# Patient Record
Sex: Female | Born: 1990 | Race: Black or African American | Hispanic: No | Marital: Single | Smoking: Never smoker
Health system: Southern US, Community
[De-identification: ages and names within clinical notes are randomized; demographics above are authoritative.]

## PROBLEM LIST (undated history)

## (undated) DIAGNOSIS — J45909 Unspecified asthma, uncomplicated: Secondary | ICD-10-CM

## (undated) DIAGNOSIS — L309 Dermatitis, unspecified: Secondary | ICD-10-CM

---

## 2018-07-29 ENCOUNTER — Encounter (HOSPITAL_COMMUNITY): Payer: Self-pay | Admitting: Emergency Medicine

## 2018-07-29 ENCOUNTER — Inpatient Hospital Stay (HOSPITAL_COMMUNITY)
Admission: EM | Admit: 2018-07-29 | Discharge: 2018-08-02 | DRG: 871 | Disposition: A | Discharge status: Home / Self Care | Payer: BLUE CROSS/BLUE SHIELD | Attending: Internal Medicine | Admitting: Internal Medicine

## 2018-07-29 ENCOUNTER — Emergency Department (HOSPITAL_COMMUNITY): Payer: BLUE CROSS/BLUE SHIELD

## 2018-07-29 DIAGNOSIS — Z23 Encounter for immunization: Secondary | ICD-10-CM

## 2018-07-29 DIAGNOSIS — R000 Tachycardia, unspecified: Secondary | ICD-10-CM | POA: Diagnosis not present

## 2018-07-29 DIAGNOSIS — E872 Acidosis: Secondary | ICD-10-CM | POA: Diagnosis present

## 2018-07-29 DIAGNOSIS — E8770 Fluid overload, unspecified: Secondary | ICD-10-CM | POA: Diagnosis present

## 2018-07-29 DIAGNOSIS — T380X5A Adverse effect of glucocorticoids and synthetic analogues, initial encounter: Secondary | ICD-10-CM | POA: Diagnosis present

## 2018-07-29 DIAGNOSIS — Z79899 Other long term (current) drug therapy: Secondary | ICD-10-CM

## 2018-07-29 DIAGNOSIS — A419 Sepsis, unspecified organism: Principal | ICD-10-CM | POA: Diagnosis present

## 2018-07-29 DIAGNOSIS — J45990 Exercise induced bronchospasm: Secondary | ICD-10-CM | POA: Diagnosis present

## 2018-07-29 DIAGNOSIS — D72829 Elevated white blood cell count, unspecified: Secondary | ICD-10-CM | POA: Diagnosis present

## 2018-07-29 DIAGNOSIS — J45901 Unspecified asthma with (acute) exacerbation: Secondary | ICD-10-CM | POA: Diagnosis present

## 2018-07-29 DIAGNOSIS — J189 Pneumonia, unspecified organism: Secondary | ICD-10-CM

## 2018-07-29 DIAGNOSIS — N179 Acute kidney failure, unspecified: Secondary | ICD-10-CM | POA: Diagnosis present

## 2018-07-29 DIAGNOSIS — J181 Lobar pneumonia, unspecified organism: Secondary | ICD-10-CM | POA: Diagnosis present

## 2018-07-29 DIAGNOSIS — J9601 Acute respiratory failure with hypoxia: Secondary | ICD-10-CM | POA: Diagnosis present

## 2018-07-29 HISTORY — DX: Unspecified asthma, uncomplicated: J45.909

## 2018-07-29 HISTORY — DX: Dermatitis, unspecified: L30.9

## 2018-07-29 LAB — CBC WITH DIFFERENTIAL/PLATELET
ABS IMMATURE GRANULOCYTES: 0.1 10*3/uL (ref 0.0–0.1)
BASOS ABS: 0 10*3/uL (ref 0.0–0.1)
Basophils Relative: 0 %
Eosinophils Absolute: 0.3 10*3/uL (ref 0.0–0.7)
Eosinophils Relative: 3 %
HCT: 43.4 % (ref 36.0–46.0)
Hemoglobin: 14.2 g/dL (ref 12.0–15.0)
Immature Granulocytes: 1 %
LYMPHS PCT: 7 %
Lymphs Abs: 0.7 10*3/uL (ref 0.7–4.0)
MCH: 30.7 pg (ref 26.0–34.0)
MCHC: 32.7 g/dL (ref 30.0–36.0)
MCV: 93.7 fL (ref 78.0–100.0)
Monocytes Absolute: 0.9 10*3/uL (ref 0.1–1.0)
Monocytes Relative: 8 %
NEUTROS ABS: 8.7 10*3/uL — AB (ref 1.7–7.7)
Neutrophils Relative %: 81 %
Platelets: 266 10*3/uL (ref 150–400)
RBC: 4.63 MIL/uL (ref 3.87–5.11)
RDW: 12.3 % (ref 11.5–15.5)
WBC: 10.7 10*3/uL — AB (ref 4.0–10.5)

## 2018-07-29 LAB — INFLUENZA PANEL BY PCR (TYPE A & B)
Influenza A By PCR: NEGATIVE
Influenza B By PCR: NEGATIVE

## 2018-07-29 LAB — I-STAT TROPONIN, ED: Troponin i, poc: 0.02 ng/mL (ref 0.00–0.08)

## 2018-07-29 LAB — COMPREHENSIVE METABOLIC PANEL
ALBUMIN: 3.9 g/dL (ref 3.5–5.0)
ALT: 14 U/L (ref 0–44)
ANION GAP: 13 (ref 5–15)
AST: 23 U/L (ref 15–41)
Alkaline Phosphatase: 54 U/L (ref 38–126)
BUN: 10 mg/dL (ref 6–20)
CO2: 18 mmol/L — ABNORMAL LOW (ref 22–32)
Calcium: 9.1 mg/dL (ref 8.9–10.3)
Chloride: 107 mmol/L (ref 98–111)
Creatinine, Ser: 0.98 mg/dL (ref 0.44–1.00)
GFR calc Af Amer: >60 mL/min (ref >60)
Glucose, Bld: 119 mg/dL — ABNORMAL HIGH (ref 70–99)
POTASSIUM: 3.6 mmol/L (ref 3.5–5.1)
Sodium: 138 mmol/L (ref 135–145)
TOTAL PROTEIN: 7.5 g/dL (ref 6.5–8.1)
Total Bilirubin: 0.7 mg/dL (ref 0.3–1.2)

## 2018-07-29 LAB — URINALYSIS, ROUTINE W REFLEX MICROSCOPIC
BILIRUBIN URINE: NEGATIVE
Glucose, UA: NEGATIVE mg/dL
Hgb urine dipstick: NEGATIVE
Ketones, ur: NEGATIVE mg/dL
Leukocytes, UA: NEGATIVE
NITRITE: NEGATIVE
Protein, ur: NEGATIVE mg/dL
Specific Gravity, Urine: 1.031 — ABNORMAL HIGH (ref 1.005–1.030)
pH: 5 (ref 5.0–8.0)

## 2018-07-29 LAB — I-STAT CG4 LACTIC ACID, ED
LACTIC ACID, VENOUS: 2.78 mmol/L — AB (ref 0.5–1.9)
LACTIC ACID, VENOUS: 4.98 mmol/L — AB (ref 0.5–1.9)

## 2018-07-29 LAB — I-STAT BETA HCG BLOOD, ED (MC, WL, AP ONLY)

## 2018-07-29 MED ORDER — SODIUM CHLORIDE 0.9 % IV BOLUS
1000.0000 mL | Freq: Once | INTRAVENOUS | Status: AC
  Administered 2018-07-29: 1000 mL via INTRAVENOUS

## 2018-07-29 MED ORDER — IPRATROPIUM BROMIDE 0.02 % IN SOLN
0.5000 mg | Freq: Once | RESPIRATORY_TRACT | Status: AC
  Administered 2018-07-29: 0.5 mg via RESPIRATORY_TRACT
  Filled 2018-07-29: qty 2.5

## 2018-07-29 MED ORDER — ALBUTEROL SULFATE (2.5 MG/3ML) 0.083% IN NEBU
5.0000 mg | INHALATION_SOLUTION | Freq: Once | RESPIRATORY_TRACT | Status: AC
  Administered 2018-07-29: 5 mg via RESPIRATORY_TRACT
  Filled 2018-07-29: qty 6

## 2018-07-29 MED ORDER — ONDANSETRON HCL 4 MG/2ML IJ SOLN
4.0000 mg | Freq: Once | INTRAMUSCULAR | Status: DC
  Filled 2018-07-29: qty 2

## 2018-07-29 MED ORDER — METHYLPREDNISOLONE SODIUM SUCC 125 MG IJ SOLR
125.0000 mg | Freq: Once | INTRAMUSCULAR | Status: AC
  Administered 2018-07-29: 125 mg via INTRAVENOUS
  Filled 2018-07-29: qty 2

## 2018-07-29 NOTE — ED Provider Notes (Signed)
[facility] EMERGENCY DEPARTMENT Provider Note   CSN: [id number] Arrival date & time: 07/29/18  1907     History   Chief Complaint Chief Complaint  Patient presents with  . Fever  . Shortness of Breath    HPI Joanna Swanson is a 27 y.o. female.  The history is provided by the patient and medical records.     27 y.o. F with hx of asthma here with fever/chills, generalized body aches, and worsening asthma exacerbation for the past 24 hours.  States she was around someone with a cold recently.  She has had very minor cough, non-productive.  She denies any chest pain.  No abdominal pain, nausea, vomiting, diarrhea.  No urinary symptoms.  States she has used 54 pumps of her albuterol inhaler today without relief.  Has not had anything to eat/drink all day today, has not really had much of an appetite.  Past Medical History:  Diagnosis Date  . Asthma     There are no active problems to display for this patient.   History reviewed. No pertinent surgical history.   OB History   None      Home Medications    Prior to Admission medications   Not on File    Family History No family history on file.  Social History Social History   Tobacco Use  . Smoking status: Never Smoker  . Smokeless tobacco: Never Used  Substance Use Topics  . Alcohol use: Yes    Comment: Socially   . Drug use: Not Currently     Allergies   Patient has no known allergies.   Review of Systems Review of Systems  Constitutional: Positive for fever.  Respiratory: Positive for shortness of breath and wheezing.   Musculoskeletal: Positive for myalgias.  All other systems reviewed and are negative.    Physical Exam Updated Vital Signs BP (!) 110/92 (BP Location: Right Arm)   Pulse (!) 127   Temp 100.2 F (37.9 C) (Oral)   Resp 20   Ht 5\' 10"  (1.778 m)   Wt 83.9 kg   SpO2 93%   BMI 26.54 kg/m   Physical Exam  Constitutional: She is oriented to person,  place, and time. She appears well-developed and well-nourished.  Overall appears fairly well, no acute distress  HENT:  Head: Normocephalic and atraumatic.  Right Ear: Tympanic membrane and ear canal normal.  Left Ear: Tympanic membrane and ear canal normal.  Nose: Nose normal.  Mouth/Throat: Oropharynx is clear and moist. Mucous membranes are dry.  Eyes: Pupils are equal, round, and reactive to light. Conjunctivae and EOM are normal.  Neck: Normal range of motion.  Cardiovascular: Regular rhythm and normal heart sounds. Tachycardia present.  HR 120's-130's during exam, no murmur  Pulmonary/Chest: Effort normal. She has no decreased breath sounds. She has wheezes.  No acute respiratory distress, able to speak in full sentences, does have some diffuse expiratory wheezes, no rhonchi or rales  Abdominal: Soft. Bowel sounds are normal. There is no tenderness. There is no rebound.  Musculoskeletal: Normal range of motion.  Neurological: She is alert and oriented to person, place, and time.  Skin: Skin is warm and dry.  Psychiatric: She has a normal mood and affect.  Nursing note and vitals reviewed.    ED Treatments / Results  Labs (all labs ordered are listed, but only abnormal results are displayed) Labs Reviewed  COMPREHENSIVE METABOLIC PANEL - Abnormal; Notable for the following components:  Result Value   CO2 18 (*)    Glucose, Bld 119 (*)    All other components within normal limits  CBC WITH DIFFERENTIAL/PLATELET - Abnormal; Notable for the following components:   WBC 10.7 (*)    Neutro Abs 8.7 (*)    All other components within normal limits  URINALYSIS, ROUTINE W REFLEX MICROSCOPIC - Abnormal; Notable for the following components:   APPearance CLOUDY (*)    Specific Gravity, Urine 1.031 (*)    All other components within normal limits  I-STAT CG4 LACTIC ACID, ED - Abnormal; Notable for the following components:   Lactic Acid, Venous 2.78 (*)    All other components  within normal limits  I-STAT CG4 LACTIC ACID, ED - Abnormal; Notable for the following components:   Lactic Acid, Venous 4.98 (*)    All other components within normal limits  CULTURE, BLOOD (ROUTINE X 2)  CULTURE, BLOOD (ROUTINE X 2)  INFLUENZA PANEL BY PCR (TYPE A & B)  I-STAT BETA HCG BLOOD, ED ([facility], [facility], [facility] ONLY)  I-STAT TROPONIN, ED    EKG None  Radiology Dg Chest 2 View  Result Date: 07/29/2018 CLINICAL DATA:  Shortness of breath and fever.  Asthma. EXAM: CHEST - 2 VIEW COMPARISON:  None. FINDINGS: The heart size and mediastinal contours are within normal limits. No evidence of pulmonary infiltrate or pleural effusion. Mild central peribronchial thickening noted, however no evidence of pulmonary hyperinflation. The visualized skeletal structures are unremarkable. IMPRESSION: No active cardiopulmonary disease. Electronically Signed   By: Myles Rosenthal M.D.   On: 07/29/2018 20:06    Procedures Procedures (including critical care time)  CRITICAL CARE Performed by: Garlon Hatchet   Total critical care time: 35 minutes  Critical care time was exclusive of separately billable procedures and treating other patients.  Critical care was necessary to treat or prevent imminent or life-threatening deterioration.  Critical care was time spent personally by me on the following activities: development of treatment plan with patient and/or surrogate as well as nursing, discussions with consultants, evaluation of patient's response to treatment, examination of patient, obtaining history from patient or surrogate, ordering and performing treatments and interventions, ordering and review of laboratory studies, ordering and review of radiographic studies, pulse oximetry and re-evaluation of patient's condition.   Medications Ordered in ED Medications  ondansetron (ZOFRAN) injection 4 mg (4 mg Intravenous Refused 07/29/18 2301)  iopamidol (ISOVUE-370) 76 % injection (has no administration in time  range)  cefTRIAXone (ROCEPHIN) 1 g in sodium chloride 0.9 % 100 mL IVPB (1 g Intravenous New Bag/Given 07/30/18 0246)  azithromycin (ZITHROMAX) 500 mg in sodium chloride 0.9 % 250 mL IVPB (has no administration in time range)  albuterol (PROVENTIL) (2.5 MG/3ML) 0.083% nebulizer solution 5 mg (5 mg Nebulization Given 07/29/18 1932)  sodium chloride 0.9 % bolus 1,000 mL (0 mLs Intravenous Stopped 07/30/18 0101)  albuterol (PROVENTIL) (2.5 MG/3ML) 0.083% nebulizer solution 5 mg (5 mg Nebulization Given 07/29/18 2257)  ipratropium (ATROVENT) nebulizer solution 0.5 mg (0.5 mg Nebulization Given 07/29/18 2257)  methylPREDNISolone sodium succinate (SOLU-MEDROL) 125 mg/2 mL injection 125 mg (125 mg Intravenous Given 07/29/18 2258)  sodium chloride 0.9 % bolus 1,000 mL (0 mLs Intravenous Stopped 07/30/18 0101)  ketorolac (TORADOL) 30 MG/ML injection 30 mg (30 mg Intravenous Given 07/30/18 0059)  acetaminophen (TYLENOL) tablet 1,000 mg (1,000 mg Oral Given 07/30/18 0058)  sodium chloride 0.9 % bolus 1,000 mL (0 mLs Intravenous Stopped 07/30/18 0159)  iopamidol (ISOVUE-370) 76 % injection 100  mL (54 mLs Intravenous Contrast Given 07/30/18 0157)     Initial Impression / Assessment and Plan / ED Course  I have reviewed the triage vital signs and the nursing notes.  Pertinent labs & imaging results that were available during my care of the patient were reviewed by me and considered in my medical decision making (see chart for details).  27 year old female here with low-grade fever, shortness of breath, and body aches for the past 24 hours.  Mildly febrile but overall nontoxic in appearance.  Initial lactate 2.78, WBC count 10.7.  Chest x-ray clear.  Influenza test also negative.  Patient does report sick contacts with cold recently and feels like her asthma symptoms are worsening.  Was given neb in triage with some improvement but symptoms recurring.  She remains tachycardic into the upper 120s, low 130s.  Does have  some wheezes on exam.  CXR clear.  We will plan for IV fluids, Solu-Medrol, additional nebs.  Given Tylenol for fever.  Notified by lab the second lactate now 4.94.  Blood cultures added.  1:00 AM Patient reassessed.  Has received 2L of IVF and HR still around 126.  States she still has some intermittent SOB.  States chest mostly feels tight centrally.  Denies hx of DVT or PE, no family history of clotting disorder.  Not on OCP's or other exogenous estrogens, no recent travel, surgery, prolonged immobilization.  Will obtain CTA to evaluate for PE or possible occult pneumonia not seen on CXR.  2:29 AM CTA negative for PE but does reveal RML and RUL pneumonia.  After 3 L, patient's heart rate still 120, blood pressure still soft at 100/52.  At this point has not had any great improvement with interventions offered here in the ED.  Will admit for IV antibiotics and ongoing management.  IV ropcehin and azithromycin started.  Discussed with Dr. Antionette Char-- will admit for ongoing care.  Final Clinical Impressions(s) / ED Diagnoses   Final diagnoses:  Sepsis due to pneumonia [facility])  Tachycardia    ED Discharge Orders    None       Garlon Hatchet, PA-C 07/30/18 0257    Sabas Sous, MD 08/04/18 1146

## 2018-07-29 NOTE — ED Notes (Signed)
Pt walked to room with steady gait

## 2018-07-29 NOTE — ED Notes (Signed)
Dr. Rush Landmark aware Lactic 2.78

## 2018-07-29 NOTE — ED Triage Notes (Signed)
Pt reports SOB, fever/chills, body aches X1 day. Pt states she has hx of asthma, has been using her inhaler at home with no relief.

## 2018-07-30 ENCOUNTER — Encounter (HOSPITAL_COMMUNITY): Payer: Self-pay | Admitting: Family Medicine

## 2018-07-30 ENCOUNTER — Emergency Department (HOSPITAL_COMMUNITY): Payer: BLUE CROSS/BLUE SHIELD

## 2018-07-30 DIAGNOSIS — J45901 Unspecified asthma with (acute) exacerbation: Secondary | ICD-10-CM | POA: Diagnosis present

## 2018-07-30 DIAGNOSIS — A419 Sepsis, unspecified organism: Secondary | ICD-10-CM | POA: Diagnosis present

## 2018-07-30 DIAGNOSIS — J189 Pneumonia, unspecified organism: Secondary | ICD-10-CM

## 2018-07-30 LAB — CBC WITH DIFFERENTIAL/PLATELET
Abs Immature Granulocytes: 0 10*3/uL (ref 0.0–0.1)
BASOS ABS: 0 10*3/uL (ref 0.0–0.1)
BASOS PCT: 0 %
EOS PCT: 0 %
Eosinophils Absolute: 0 10*3/uL (ref 0.0–0.7)
HCT: 37.7 % (ref 36.0–46.0)
HEMOGLOBIN: 12.5 g/dL (ref 12.0–15.0)
Immature Granulocytes: 0 %
Lymphocytes Relative: 7 %
Lymphs Abs: 0.7 10*3/uL (ref 0.7–4.0)
MCH: 30.9 pg (ref 26.0–34.0)
MCHC: 33.2 g/dL (ref 30.0–36.0)
MCV: 93.1 fL (ref 78.0–100.0)
MONO ABS: 0.2 10*3/uL (ref 0.1–1.0)
Monocytes Relative: 2 %
Neutro Abs: 8.6 10*3/uL — ABNORMAL HIGH (ref 1.7–7.7)
Neutrophils Relative %: 91 %
Platelets: 218 10*3/uL (ref 150–400)
RBC: 4.05 MIL/uL (ref 3.87–5.11)
RDW: 12.3 % (ref 11.5–15.5)
WBC: 9.5 10*3/uL (ref 4.0–10.5)

## 2018-07-30 LAB — BASIC METABOLIC PANEL
Anion gap: 7 (ref 5–15)
BUN: 7 mg/dL (ref 6–20)
CALCIUM: 8.5 mg/dL — AB (ref 8.9–10.3)
CO2: 18 mmol/L — ABNORMAL LOW (ref 22–32)
Chloride: 112 mmol/L — ABNORMAL HIGH (ref 98–111)
Creatinine, Ser: 0.86 mg/dL (ref 0.44–1.00)
GFR calc Af Amer: >60 mL/min (ref >60)
GLUCOSE: 163 mg/dL — AB (ref 70–99)
Potassium: 3.8 mmol/L (ref 3.5–5.1)
SODIUM: 137 mmol/L (ref 135–145)

## 2018-07-30 LAB — HCG, QUANTITATIVE, PREGNANCY

## 2018-07-30 LAB — CBG MONITORING, ED: Glucose-Capillary: 148 mg/dL — ABNORMAL HIGH (ref 70–99)

## 2018-07-30 LAB — STREP PNEUMONIAE URINARY ANTIGEN: Strep Pneumo Urinary Antigen: NEGATIVE

## 2018-07-30 LAB — HIV ANTIBODY (ROUTINE TESTING W REFLEX): HIV SCREEN 4TH GENERATION: NONREACTIVE

## 2018-07-30 MED ORDER — SODIUM CHLORIDE 0.9 % IV SOLN
500.0000 mg | Freq: Once | INTRAVENOUS | Status: AC
  Administered 2018-07-30: 500 mg via INTRAVENOUS
  Filled 2018-07-30: qty 500

## 2018-07-30 MED ORDER — SODIUM CHLORIDE 0.9 % IV SOLN
1.0000 g | INTRAVENOUS | Status: DC
  Administered 2018-07-31: 1 g via INTRAVENOUS
  Filled 2018-07-30 (×2): qty 10

## 2018-07-30 MED ORDER — POTASSIUM CHLORIDE IN NACL 20-0.9 MEQ/L-% IV SOLN
INTRAVENOUS | Status: AC
  Administered 2018-07-30: 06:00:00 via INTRAVENOUS
  Filled 2018-07-30: qty 1000

## 2018-07-30 MED ORDER — SODIUM CHLORIDE 0.9% FLUSH
3.0000 mL | Freq: Two times a day (BID) | INTRAVENOUS | Status: DC
  Administered 2018-07-30 – 2018-08-02 (×7): 3 mL via INTRAVENOUS

## 2018-07-30 MED ORDER — IOPAMIDOL (ISOVUE-370) INJECTION 76%
INTRAVENOUS | Status: AC
  Filled 2018-07-30: qty 100

## 2018-07-30 MED ORDER — ALBUTEROL SULFATE (2.5 MG/3ML) 0.083% IN NEBU
2.5000 mg | INHALATION_SOLUTION | RESPIRATORY_TRACT | Status: DC | PRN
  Administered 2018-07-30 – 2018-07-31 (×3): 2.5 mg via RESPIRATORY_TRACT
  Filled 2018-07-30 (×3): qty 3

## 2018-07-30 MED ORDER — METHYLPREDNISOLONE SODIUM SUCC 125 MG IJ SOLR
60.0000 mg | Freq: Three times a day (TID) | INTRAMUSCULAR | Status: DC
  Administered 2018-07-30 – 2018-07-31 (×4): 60 mg via INTRAVENOUS
  Filled 2018-07-30 (×4): qty 2

## 2018-07-30 MED ORDER — HYDROCODONE-ACETAMINOPHEN 5-325 MG PO TABS: 1.0000 | ORAL_TABLET | ORAL | Status: DC | PRN

## 2018-07-30 MED ORDER — ACETAMINOPHEN 500 MG PO TABS
1000.0000 mg | ORAL_TABLET | Freq: Once | ORAL | Status: AC
  Administered 2018-07-30: 1000 mg via ORAL
  Filled 2018-07-30: qty 2

## 2018-07-30 MED ORDER — DIPHENHYDRAMINE HCL 25 MG PO CAPS
25.0000 mg | ORAL_CAPSULE | Freq: Four times a day (QID) | ORAL | Status: DC | PRN
  Administered 2018-07-30 – 2018-07-31 (×2): 25 mg via ORAL
  Filled 2018-07-30 (×3): qty 1

## 2018-07-30 MED ORDER — IOPAMIDOL (ISOVUE-370) INJECTION 76%
100.0000 mL | Freq: Once | INTRAVENOUS | Status: AC | PRN
  Administered 2018-07-30: 54 mL via INTRAVENOUS

## 2018-07-30 MED ORDER — SENNOSIDES-DOCUSATE SODIUM 8.6-50 MG PO TABS: 1.0000 | ORAL_TABLET | Freq: Every evening | ORAL | Status: DC | PRN

## 2018-07-30 MED ORDER — PREDNISONE 20 MG PO TABS
40.0000 mg | ORAL_TABLET | Freq: Every day | ORAL | Status: DC
  Administered 2018-07-30: 40 mg via ORAL
  Filled 2018-07-30: qty 2

## 2018-07-30 MED ORDER — SODIUM CHLORIDE 0.9 % IV BOLUS
1000.0000 mL | Freq: Once | INTRAVENOUS | Status: AC
  Administered 2018-07-30: 1000 mL via INTRAVENOUS

## 2018-07-30 MED ORDER — KETOROLAC TROMETHAMINE 30 MG/ML IJ SOLN
30.0000 mg | Freq: Once | INTRAMUSCULAR | Status: AC
  Administered 2018-07-30: 30 mg via INTRAVENOUS
  Filled 2018-07-30: qty 1

## 2018-07-30 MED ORDER — SODIUM CHLORIDE 0.9 % IV SOLN
25.0000 mg | Freq: Four times a day (QID) | INTRAVENOUS | Status: DC | PRN
  Filled 2018-07-30: qty 0.5

## 2018-07-30 MED ORDER — TRIAMCINOLONE 0.1 % CREAM:EUCERIN CREAM 1:1: TOPICAL_CREAM | Freq: Three times a day (TID) | CUTANEOUS | Status: DC | PRN

## 2018-07-30 MED ORDER — SODIUM CHLORIDE 0.9 % IV SOLN
500.0000 mg | INTRAVENOUS | Status: DC
  Administered 2018-07-31 – 2018-08-01 (×2): 500 mg via INTRAVENOUS
  Filled 2018-07-30 (×2): qty 500

## 2018-07-30 MED ORDER — ONDANSETRON HCL 4 MG PO TABS: 4.0000 mg | ORAL_TABLET | Freq: Four times a day (QID) | ORAL | Status: DC | PRN

## 2018-07-30 MED ORDER — ONDANSETRON HCL 4 MG/2ML IJ SOLN: 4.0000 mg | Freq: Four times a day (QID) | INTRAMUSCULAR | Status: DC | PRN

## 2018-07-30 MED ORDER — MAGNESIUM SULFATE 2 GM/50ML IV SOLN
2.0000 g | Freq: Once | INTRAVENOUS | Status: AC
  Administered 2018-07-30: 2 g via INTRAVENOUS
  Filled 2018-07-30: qty 50

## 2018-07-30 MED ORDER — ACETAMINOPHEN 650 MG RE SUPP: 650.0000 mg | Freq: Four times a day (QID) | RECTAL | Status: DC | PRN

## 2018-07-30 MED ORDER — ACETAMINOPHEN 325 MG PO TABS: 650.0000 mg | ORAL_TABLET | Freq: Four times a day (QID) | ORAL | Status: DC | PRN

## 2018-07-30 MED ORDER — ENOXAPARIN SODIUM 40 MG/0.4ML ~~LOC~~ SOLN
40.0000 mg | SUBCUTANEOUS | Status: DC
  Administered 2018-07-30 – 2018-08-01 (×3): 40 mg via SUBCUTANEOUS
  Filled 2018-07-30 (×4): qty 0.4

## 2018-07-30 MED ORDER — SODIUM CHLORIDE 0.9 % IV SOLN
1.0000 g | Freq: Once | INTRAVENOUS | Status: AC
  Administered 2018-07-30: 1 g via INTRAVENOUS
  Filled 2018-07-30: qty 10

## 2018-07-30 NOTE — ED Notes (Signed)
Admitting page,   Wilhemena Durie room c26 I can prednisone at 0725 do you still want me to give the Solumedrol now?   Abygail Galeno RN

## 2018-07-30 NOTE — ED Notes (Signed)
Breakfast Tray Ordered @ 0718-per Janee, RN-called by Marylene Land

## 2018-07-30 NOTE — ED Notes (Signed)
Provider re-paged. 

## 2018-07-30 NOTE — ED Notes (Signed)
Pt CBG 148 

## 2018-07-30 NOTE — ED Notes (Signed)
Pt ambulated with steady gait and no SOB or dizziness. Pt oxygen level stayed at 94% and pulse was 114.

## 2018-07-30 NOTE — ED Notes (Signed)
Admitting at bedside.   Joanna Swanson  has requested Benadryl for her allergies.   Jesse Sans RN

## 2018-07-30 NOTE — ED Notes (Signed)
Patient eating breakfast. No complaints at this time.

## 2018-07-30 NOTE — ED Notes (Signed)
Breakfast Tray Ordered. 

## 2018-07-30 NOTE — ED Notes (Signed)
Pt. To CT via stretcher. 

## 2018-07-30 NOTE — ED Notes (Signed)
Pt. Given graham crackers with peanut butter and ginger ale.

## 2018-07-30 NOTE — H&P (Signed)
History and Physical    Joanna Swanson JXB:[id number] DOB: 1991-02-15 DOA: 07/29/2018  PCP: Patient, No Pcp Per   Patient coming from: Home   Chief Complaint: Fever/chills, SOB, productive cough, wheezing   HPI: Joanna Swanson is a 27 y.o. female with medical history significant for asthma and eczema, now presenting to the emergency department for evaluation of fevers, chills, shortness of breath, wheezing, and productive cough.  Patient reports that symptoms began 2 days prior to arrival, she began to use her inhaler, but reports progressive worsening despite her albuterol.  She denies any chest pain, leg swelling, or leg tenderness.  Denies any sick contacts or recent travel.  Reports that she had been experiencing some rhinorrhea and sore throat recently, but that has resolved.  ED Course: Upon arrival to the ED, patient is found to have a temperature 37.9 C, tachycardia in the 120s, blood pressure 90/56, and O2 saturation in the low 90s on room air.  EKG features sinus tachycardia with rate 125 and PAC.  Chest x-ray is negative for acute cardiopulmonary disease.  Chemistry panel is notable for a bicarbonate of 18 and CBC features a mild leukocytosis.  CTA chest is negative for PE but notable for patchy consolidation involving the right middle lobe, and to a lesser extent the right upper lobe, concerning for pneumonia.  Blood cultures were collected and the patient was treated with 3 L of normal saline, 125 mg IV Solu-Medrol, Rocephin, azithromycin, DuoNeb, and albuterol neb.  She reports subjective improvement with these measures and appears to have less work of breathing, but remains dyspneic at rest and lactic acid has increased.  She will be observed for further evaluation and management of community-acquired pneumonia with exacerbation in asthma.  Review of Systems:  All other systems reviewed and apart from HPI, are negative.  Past Medical History:  Diagnosis Date  . Asthma   . Eczema      History reviewed. No pertinent surgical history.   reports that she has never smoked. She has never used smokeless tobacco. She reports that she drinks alcohol. She reports that she has current or past drug history.  No Known Allergies  Family History  Problem Relation Age of Onset  . Hypertension Father   . Hypertension Paternal Grandfather      Prior to Admission medications   Medication Sig Start Date End Date Taking? Authorizing Provider  albuterol (PROVENTIL HFA;VENTOLIN HFA) 108 (90 Base) MCG/ACT inhaler Inhale 2 puffs into the lungs every 4 (four) hours as needed for wheezing or shortness of breath.  05/21/18  Yes [provider]  ibuprofen (ADVIL,MOTRIN) 200 MG tablet Take 600 mg by mouth every 6 (six) hours as needed (for pain or headaches).   Yes [provider]    Physical Exam: Vitals:   07/29/18 2321 07/30/18 0000 07/30/18 0200 07/30/18 0300  BP:  (!) 105/59 (!) 100/52 106/70  Pulse:  (!) 129 (!) 116 (!) 117  Resp: 19 (!) 22 16 16   Temp:      TempSrc:      SpO2:  97% 94% 95%  Weight:      Height:         Constitutional: Tachypneic, calm, no pallor Eyes: PERTLA, lids and conjunctivae normal ENMT: Mucous membranes are moist. Posterior pharynx clear of any exudate or lesions.   Neck: normal, supple, no masses, no thyromegaly Respiratory: Tachypneic, dyspneic with speech. Rhonchi, wheezes. No pallor or cyanosis. No accessory muscle use.  Cardiovascular: Rate ~120 and regular.  No extremity edema.   Abdomen: No distension, no tenderness, soft. Bowel sounds normal.  Musculoskeletal: no clubbing / cyanosis. No joint deformity upper and lower extremities.    Skin: Xerosis with lichenification involving AC fossae and nape of neck. Warm, dry, well-perfused. Neurologic: CN 2-12 grossly intact. Sensation intact. Strength 5/5 in all 4 limbs.  Psychiatric:  Alert and oriented x 3. Pleasant and cooperative.    Labs on Admission: I have personally  reviewed following labs and imaging studies  CBC: Recent Labs  Lab 07/29/18 1936  WBC 10.7*  NEUTROABS 8.7*  HGB 14.2  HCT 43.4  MCV 93.7  PLT 266   Basic Metabolic Panel: Recent Labs  Lab 07/29/18 1936  NA 138  K 3.6  CL 107  CO2 18*  GLUCOSE 119*  BUN 10  CREATININE 0.98  CALCIUM 9.1   GFR: Estimated Creatinine Clearance: 103.5 mL/min (by C-G formula based on SCr of 0.98 mg/dL). Liver Function Tests: Recent Labs  Lab 07/29/18 1936  AST 23  ALT 14  ALKPHOS 54  BILITOT 0.7  PROT 7.5  ALBUMIN 3.9   No results for input(s): LIPASE, AMYLASE in the last 168 hours. No results for input(s): AMMONIA in the last 168 hours. Coagulation Profile: No results for input(s): INR, PROTIME in the last 168 hours. Cardiac Enzymes: No results for input(s): CKTOTAL, CKMB, CKMBINDEX, TROPONINI in the last 168 hours. BNP (last 3 results) No results for input(s): PROBNP in the last 8760 hours. HbA1C: No results for input(s): HGBA1C in the last 72 hours. CBG: No results for input(s): GLUCAP in the last 168 hours. Lipid Profile: No results for input(s): CHOL, HDL, LDLCALC, TRIG, CHOLHDL, LDLDIRECT in the last 72 hours. Thyroid Function Tests: No results for input(s): TSH, T4TOTAL, FREET4, T3FREE, THYROIDAB in the last 72 hours. Anemia Panel: No results for input(s): VITAMINB12, FOLATE, FERRITIN, TIBC, IRON, RETICCTPCT in the last 72 hours. Urine analysis:    Component Value Date/Time   COLORURINE YELLOW 07/29/2018 1929   APPEARANCEUR CLOUDY (A) 07/29/2018 1929   LABSPEC 1.031 (H) 07/29/2018 1929   PHURINE 5.0 07/29/2018 1929   GLUCOSEU NEGATIVE 07/29/2018 1929   HGBUR NEGATIVE 07/29/2018 1929   BILIRUBINUR NEGATIVE 07/29/2018 1929   KETONESUR NEGATIVE 07/29/2018 1929   PROTEINUR NEGATIVE 07/29/2018 1929   NITRITE NEGATIVE 07/29/2018 1929   LEUKOCYTESUR NEGATIVE 07/29/2018 1929   Sepsis Labs: @LABRCNTIP (procalcitonin:4,lacticidven:4) )No results found for this or any  previous visit (from the past 240 hour(s)).   Radiological Exams on Admission: Dg Chest 2 View  Result Date: 07/29/2018 CLINICAL DATA:  Shortness of breath and fever.  Asthma. EXAM: CHEST - 2 VIEW COMPARISON:  None. FINDINGS: The heart size and mediastinal contours are within normal limits. No evidence of pulmonary infiltrate or pleural effusion. Mild central peribronchial thickening noted, however no evidence of pulmonary hyperinflation. The visualized skeletal structures are unremarkable. IMPRESSION: No active cardiopulmonary disease. Electronically Signed   By: Myles Rosenthal M.D.   On: 07/29/2018 20:06   Ct Angio Chest Pe W And/or Wo Contrast  Result Date: 07/30/2018 CLINICAL DATA:  Shortness of breath and tachycardia. Fever and chills. EXAM: CT ANGIOGRAPHY CHEST WITH CONTRAST TECHNIQUE: Multidetector CT imaging of the chest was performed using the standard protocol during bolus administration of intravenous contrast. Multiplanar CT image reconstructions and MIPs were obtained to evaluate the vascular anatomy. CONTRAST:  44mL ISOVUE-370 IOPAMIDOL (ISOVUE-370) INJECTION 76% COMPARISON:  Radiographs yesterday. FINDINGS: Cardiovascular: There are no filling defects within the pulmonary arteries to suggest pulmonary embolus.Thoracic  aorta is normal in caliber without dissection. Common origin of the brachiocephalic and left common carotid artery, normal variant. The heart is normal in size. No pericardial effusion. Mediastinum/Nodes: Prominent bilateral hilar nodes, right greater than left measuring up to 11 mm. Small prevascular nodes. Triangular soft tissue density in the anterior mediastinum consistent with residual or recurrent thymus. The esophagus is decompressed. Visualized thyroid gland is unremarkable. Lungs/Pleura: Patchy consolidation in the anterior right middle lobe, and to a lesser extent paramediastinal and apical right upper lobe. Mild central bronchial thickening. No pleural fluid or evidence  pulmonary edema. Trachea and mainstem bronchi are patent. Upper Abdomen: No acute abnormality. Musculoskeletal: There are no acute or suspicious osseous abnormalities. Review of the MIP images confirms the above findings. IMPRESSION: 1. No pulmonary embolus. 2. Patchy consolidation in the right middle and to a lesser extent right upper lobes consistent with pneumonia. Prominent bilateral hilar nodes are likely reactive. 3. Mild central bronchial thickening may be bronchitis or asthma. Electronically Signed   By: Narda Rutherford M.D.   On: 07/30/2018 02:19    EKG: Independently reviewed. Sinus tachycardia (rate 125), PAC.   Assessment/Plan   1. Sepsis secondary to pneumonia  - Presents with SOB, productive cough, wheezing, and fever/chills  - She is found to have low-grade temp, leukocytosis, tachycardia, tachypnea, and lactic acidosis  - CTA chest negative for PE but concerning for pneumonia involving RML and RUL  - Blood cultures were collected in ED and she was started on Rocephin and azithromycin  - She has improved with treatment in ED but remains dyspneic at rest and lactic acidosis has worsened  - Check sputum culture and strep pneumo antigen, continue current antibiotics, treat associated asthma exacerbation as below, follow cultures and clinical course    2. Acute asthma exacerbation  - Presents with SOB, productive cough, wheezing  - Has improved with steroid and nebs in ED, but remains dyspneic at rest  - Continue systemic steroid and prn albuterol, give 2 g IV magnesium, use supplemental O2 as needed     DVT prophylaxis: Lovenox Code Status: Full  Family Communication: Discussed with patient  Consults called: None Admission status: Observation     Briscoe Deutscher, MD [facility] Pager [phone]  If 7PM-7AM, please contact night-coverage [email or web address] Password [facility]  07/30/2018, 4:32 AM

## 2018-07-30 NOTE — Progress Notes (Signed)
@IPLOG @        PROGRESS NOTE                                                                                                                                                                                                             Patient Demographics:    Joanna Swanson, is a 27 y.o. female, DOB - 1991-05-26, WGN:[id number]  Admit date - 07/29/2018   Admitting Physician Briscoe Deutscher, MD  Outpatient Primary MD for the patient is Patient, No Pcp Per  LOS - 0  Chief Complaint  Patient presents with  . Fever  . Shortness of Breath       Brief Narrative   Joanna Swanson is a 27 y.o. female with medical history significant for asthma and eczema, now presenting to the emergency department for evaluation of fevers, chills, shortness of breath, wheezing, and productive cough, was diagnosed with community-acquired pneumonia along with asthma exacerbation causing acute hypoxic respiratory failure and she was admitted to the hospital for further treatment.   Subjective:    Joanna Swanson today has, No headache, No chest pain, No abdominal pain - No Nausea, No new weakness tingling or numbness, No Cough - improved SOB.     Assessment  & Plan :      1.  Sepsis and acute hypoxic respiratory failure due to CAP and acute on chronic exacerbation.  She has been adequately hydrated and will continue IV fluids , continue empiric IV azithromycin and Rocephin, IV Solu-Medrol, scheduled and as needed nebulizer treatment, oxygen as needed.  Add flutter valve for pulmonary toiletry, advance activity, check ambulatory pulse ox.  Encouraged to sit in chair in the daytime.  Likely discharge in 1 to 2 days if back to baseline.  Already feels better.  Follow cultures.  Quantitative hCG ordered.    Family Communication  :  None  Code Status :  Full  Disposition Plan  :  Home 1-2 days  Consults  :  None  Procedures  :    CTA - 1. No pulmonary embolus. 2. Patchy consolidation in the right  middle and to a lesser extent right upper lobes consistent with pneumonia. Prominent bilateral hilar nodes are likely reactive. 3. Mild central bronchial thickening may be bronchitis or asthma.  DVT Prophylaxis  :  Lovenox    Lab Results  Component Value Date   PLT 218 07/30/2018    Diet :  Diet Order            Diet regular Room service  appropriate? Yes; Fluid consistency: Thin  Diet effective now               Inpatient Medications Scheduled Meds: . enoxaparin (LOVENOX) injection  40 mg Subcutaneous Q24H  . iopamidol      . methylPREDNISolone (SOLU-MEDROL) injection  60 mg Intravenous Q8H  . sodium chloride flush  3 mL Intravenous Q12H   Continuous Infusions: . 0.9 % NaCl with KCl 20 mEq / L 100 mL/hr at 07/30/18 0612  . [START ON 07/31/2018] azithromycin    . [START ON 07/31/2018] cefTRIAXone (ROCEPHIN)  IV     PRN Meds:.acetaminophen **OR** acetaminophen, albuterol, HYDROcodone-acetaminophen, ondansetron **OR** ondansetron (ZOFRAN) IV, senna-docusate, triamcinolone 0.1 % cream : eucerin  Antibiotics  :   Anti-infectives (From admission, onward)   Start     Dose/Rate Route Frequency Ordered Stop   07/31/18 0400  azithromycin (ZITHROMAX) 500 mg in sodium chloride 0.9 % 250 mL IVPB     500 mg 250 mL/hr over 60 Minutes Intravenous Every 24 hours 07/30/18 0432 08/06/18 0359   07/31/18 0200  cefTRIAXone (ROCEPHIN) 1 g in sodium chloride 0.9 % 100 mL IVPB     1 g 200 mL/hr over 30 Minutes Intravenous Every 24 hours 07/30/18 0432 08/06/18 0159   07/30/18 0230  cefTRIAXone (ROCEPHIN) 1 g in sodium chloride 0.9 % 100 mL IVPB     1 g 200 mL/hr over 30 Minutes Intravenous  Once 07/30/18 0229 07/30/18 0323   07/30/18 0230  azithromycin (ZITHROMAX) 500 mg in sodium chloride 0.9 % 250 mL IVPB     500 mg 250 mL/hr over 60 Minutes Intravenous  Once 07/30/18 0229 07/30/18 0433          Objective:   Vitals:   07/30/18 0600 07/30/18 0700 07/30/18 0800 07/30/18 0815  BP: 113/70  112/63 110/64   Pulse: (!) 113 (!) 116 (!) 112 (!) 102  Resp: (!) 24 20 (!) 28 16  Temp:      TempSrc:      SpO2: 96% 96% 94% 94%  Weight:      Height:        Wt Readings from Last 3 Encounters:  07/29/18 83.9 kg     Intake/Output Summary (Last 24 hours) at 07/30/2018 0919 Last data filed at 07/30/2018 0617 Gross per 24 hour  Intake 3385.94 ml  Output -  Net 3385.94 ml     Physical Exam  Awake Alert, Oriented X 3, No new F.N deficits, Normal affect [place].AT,PERRAL Supple Neck,No JVD, No cervical lymphadenopathy appriciated.  Symmetrical Chest wall movement, Good air movement bilaterally, +ve wheezing RRR,No Gallops,Rubs or new Murmurs, No Parasternal Heave +ve B.Sounds, Abd Soft, No tenderness, No organomegaly appriciated, No rebound - guarding or rigidity. No Cyanosis, Clubbing or edema, No new Rash or bruise       Data Review:    CBC Recent Labs  Lab 07/29/18 1936 07/30/18 0640  WBC 10.7* 9.5  HGB 14.2 12.5  HCT 43.4 37.7  PLT 266 218  MCV 93.7 93.1  MCH 30.7 30.9  MCHC 32.7 33.2  RDW 12.3 12.3  LYMPHSABS 0.7 0.7  MONOABS 0.9 0.2  EOSABS 0.3 0.0  BASOSABS 0.0 0.0    Chemistries  Recent Labs  Lab 07/29/18 1936 07/30/18 0500  NA 138 137  K 3.6 3.8  CL 107 112*  CO2 18* 18*  GLUCOSE 119* 163*  BUN 10 7  CREATININE 0.98 0.86  CALCIUM 9.1 8.5*  AST 23  --   ALT  14  --   ALKPHOS 54  --   BILITOT 0.7  --    ------------------------------------------------------------------------------------------------------------------ Lactic Acid, Venous    Component Value Date/Time   LATICACIDVEN 4.98 (HH) 07/29/2018 2219    ------------------------------------------------------------------------------------------------------------------ No results for input(s): TSH, T4TOTAL, T3FREE, THYROIDAB in the last 72 hours.  Invalid input(s):  FREET3 ------------------------------------------------------------------------------------------------------------------ No results for input(s): VITAMINB12, FOLATE, FERRITIN, TIBC, IRON, RETICCTPCT in the last 72 hours.  Coagulation profile No results for input(s): INR, PROTIME in the last 168 hours.  No results for input(s): DDIMER in the last 72 hours.  Cardiac Enzymes No results for input(s): CKMB, TROPONINI, MYOGLOBIN in the last 168 hours.  Invalid input(s): CK ------------------------------------------------------------------------------------------------------------------ No results found for: BNP  Micro Results Recent Results (from the past 240 hour(s))  Blood culture (routine x 2)     Status: None (Preliminary result)   Collection Time: 07/29/18 10:50 PM  Result Value Ref Range Status   Specimen Description BLOOD RIGHT ARM  Final   Special Requests   Final    BOTTLES DRAWN AEROBIC AND ANAEROBIC Blood Culture results may not be optimal due to an excessive volume of blood received in culture bottles   Culture   Final    NO GROWTH < 12 HOURS Performed at [facility], [address], [place], [place] [address]    Report Status PENDING  Incomplete    Radiology Reports Dg Chest 2 View  Result Date: 07/29/2018 CLINICAL DATA:  Shortness of breath and fever.  Asthma. EXAM: CHEST - 2 VIEW COMPARISON:  None. FINDINGS: The heart size and mediastinal contours are within normal limits. No evidence of pulmonary infiltrate or pleural effusion. Mild central peribronchial thickening noted, however no evidence of pulmonary hyperinflation. The visualized skeletal structures are unremarkable. IMPRESSION: No active cardiopulmonary disease. Electronically Signed   By: Myles Rosenthal M.D.   On: 07/29/2018 20:06   Ct Angio Chest Pe W And/or Wo Contrast  Result Date: 07/30/2018 CLINICAL DATA:  Shortness of breath and tachycardia. Fever and chills. EXAM: CT ANGIOGRAPHY CHEST WITH CONTRAST  TECHNIQUE: Multidetector CT imaging of the chest was performed using the standard protocol during bolus administration of intravenous contrast. Multiplanar CT image reconstructions and MIPs were obtained to evaluate the vascular anatomy. CONTRAST:  1mL ISOVUE-370 IOPAMIDOL (ISOVUE-370) INJECTION 76% COMPARISON:  Radiographs yesterday. FINDINGS: Cardiovascular: There are no filling defects within the pulmonary arteries to suggest pulmonary embolus.Thoracic aorta is normal in caliber without dissection. Common origin of the brachiocephalic and left common carotid artery, normal variant. The heart is normal in size. No pericardial effusion. Mediastinum/Nodes: Prominent bilateral hilar nodes, right greater than left measuring up to 11 mm. Small prevascular nodes. Triangular soft tissue density in the anterior mediastinum consistent with residual or recurrent thymus. The esophagus is decompressed. Visualized thyroid gland is unremarkable. Lungs/Pleura: Patchy consolidation in the anterior right middle lobe, and to a lesser extent paramediastinal and apical right upper lobe. Mild central bronchial thickening. No pleural fluid or evidence pulmonary edema. Trachea and mainstem bronchi are patent. Upper Abdomen: No acute abnormality. Musculoskeletal: There are no acute or suspicious osseous abnormalities. Review of the MIP images confirms the above findings. IMPRESSION: 1. No pulmonary embolus. 2. Patchy consolidation in the right middle and to a lesser extent right upper lobes consistent with pneumonia. Prominent bilateral hilar nodes are likely reactive. 3. Mild central bronchial thickening may be bronchitis or asthma. Electronically Signed   By: Narda Rutherford M.D.   On: 07/30/2018 02:19    Time  Spent in minutes  30   Susa Raring M.D on 07/30/2018 at 9:19 AM  To page go to [email or web address] - password [facility]

## 2018-07-31 DIAGNOSIS — E8770 Fluid overload, unspecified: Secondary | ICD-10-CM | POA: Diagnosis present

## 2018-07-31 DIAGNOSIS — J181 Lobar pneumonia, unspecified organism: Secondary | ICD-10-CM | POA: Diagnosis present

## 2018-07-31 DIAGNOSIS — T380X5A Adverse effect of glucocorticoids and synthetic analogues, initial encounter: Secondary | ICD-10-CM | POA: Diagnosis present

## 2018-07-31 DIAGNOSIS — N179 Acute kidney failure, unspecified: Secondary | ICD-10-CM | POA: Diagnosis present

## 2018-07-31 DIAGNOSIS — J45901 Unspecified asthma with (acute) exacerbation: Secondary | ICD-10-CM | POA: Diagnosis present

## 2018-07-31 DIAGNOSIS — J9601 Acute respiratory failure with hypoxia: Secondary | ICD-10-CM | POA: Diagnosis present

## 2018-07-31 DIAGNOSIS — Z23 Encounter for immunization: Secondary | ICD-10-CM | POA: Diagnosis not present

## 2018-07-31 DIAGNOSIS — J189 Pneumonia, unspecified organism: Secondary | ICD-10-CM | POA: Diagnosis not present

## 2018-07-31 DIAGNOSIS — A419 Sepsis, unspecified organism: Secondary | ICD-10-CM | POA: Diagnosis present

## 2018-07-31 DIAGNOSIS — Z79899 Other long term (current) drug therapy: Secondary | ICD-10-CM | POA: Diagnosis not present

## 2018-07-31 DIAGNOSIS — R000 Tachycardia, unspecified: Secondary | ICD-10-CM | POA: Diagnosis present

## 2018-07-31 DIAGNOSIS — E872 Acidosis: Secondary | ICD-10-CM | POA: Diagnosis present

## 2018-07-31 DIAGNOSIS — D72829 Elevated white blood cell count, unspecified: Secondary | ICD-10-CM | POA: Diagnosis present

## 2018-07-31 DIAGNOSIS — J45990 Exercise induced bronchospasm: Secondary | ICD-10-CM | POA: Diagnosis present

## 2018-07-31 LAB — LEGIONELLA PNEUMOPHILA SEROGP 1 UR AG: L. pneumophila Serogp 1 Ur Ag: NEGATIVE

## 2018-07-31 MED ORDER — METHYLPREDNISOLONE SODIUM SUCC 125 MG IJ SOLR
60.0000 mg | Freq: Three times a day (TID) | INTRAMUSCULAR | Status: DC
  Administered 2018-07-31 – 2018-08-01 (×2): 60 mg via INTRAVENOUS
  Filled 2018-07-31 (×2): qty 2

## 2018-07-31 MED ORDER — FUROSEMIDE 10 MG/ML IJ SOLN
40.0000 mg | Freq: Once | INTRAMUSCULAR | Status: AC
  Administered 2018-07-31: 40 mg via INTRAVENOUS
  Filled 2018-07-31: qty 4

## 2018-07-31 MED ORDER — POTASSIUM CHLORIDE CRYS ER 20 MEQ PO TBCR
40.0000 meq | EXTENDED_RELEASE_TABLET | Freq: Once | ORAL | Status: AC
  Administered 2018-07-31: 40 meq via ORAL
  Filled 2018-07-31: qty 2

## 2018-07-31 NOTE — Progress Notes (Signed)
@IPLOG @        PROGRESS NOTE                                                                                                                                                                                                             Patient Demographics:    Joanna Swanson, is a 27 y.o. female, DOB - [birth year], WUJ:[id number]  Admit date - 07/29/2018   Admitting Physician Briscoe Deutscher, MD  Outpatient Primary MD for the patient is Patient, No Pcp Per  LOS - 0  Chief Complaint  Patient presents with  . Fever  . Shortness of Breath       Brief Narrative   Joanna Swanson is a 27 y.o. female with medical history significant for asthma and eczema, now presenting to the emergency department for evaluation of fevers, chills, shortness of breath, wheezing, and productive cough, was diagnosed with community-acquired pneumonia along with asthma exacerbation causing acute hypoxic respiratory failure and she was admitted to the hospital for further treatment.   Subjective:   Patient in bed, appears comfortable, denies any headache, no fever, no chest pain or pressure, still has cough, now has developed mild orthopnea along with shortness of breath , no abdominal pain. No focal weakness.   Assessment  & Plan :      1.  Sepsis and acute hypoxic respiratory failure due to CAP and acute on chronic asthma exacerbation.  She has been aggressively hydrated with IV fluids, sepsis physiology has resolved, on empiric IV azithromycin and Rocephin combination, continue IV steroids, continue supplemental oxygen along with nebulizer treatments.  Again encouraged to sit up in chair and use flutter valve 2-3 times every hour while awake for pulmonary toiletry.  2.  Mild fluid overload.  This is due to over the last IV fluid administration, hold any further fluids, one-time Lasix and monitor.   Quantitative hCG was checked and is negative.    Family Communication  :  None  Code Status :   Full  Disposition Plan  :  Home 1-2 days  Consults  :  None  Procedures  :    CTA - 1. No pulmonary embolus. 2. Patchy consolidation in the right middle and to a lesser extent right upper lobes consistent with pneumonia. Prominent bilateral hilar nodes are likely reactive. 3. Mild central bronchial thickening may be bronchitis or asthma.  DVT Prophylaxis  :  Lovenox    Lab Results  Component Value Date   PLT 218 07/30/2018    Diet :  Diet Order            Diet regular Room service appropriate? Yes; Fluid consistency: Thin  Diet effective now               Inpatient Medications Scheduled Meds: . enoxaparin (LOVENOX) injection  40 mg Subcutaneous Q24H  . furosemide  40 mg Intravenous Once  . methylPREDNISolone (SOLU-MEDROL) injection  60 mg Intravenous Q8H  . potassium chloride  40 mEq Oral Once  . sodium chloride flush  3 mL Intravenous Q12H   Continuous Infusions: . azithromycin 500 mg (07/31/18 0400)  . cefTRIAXone (ROCEPHIN)  IV 1 g (07/31/18 0200)   PRN Meds:.acetaminophen **OR** acetaminophen, albuterol, diphenhydrAMINE, HYDROcodone-acetaminophen, ondansetron **OR** ondansetron (ZOFRAN) IV, senna-docusate, triamcinolone 0.1 % cream : eucerin  Antibiotics  :   Anti-infectives (From admission, onward)   Start     Dose/Rate Route Frequency Ordered Stop   07/31/18 0400  azithromycin (ZITHROMAX) 500 mg in sodium chloride 0.9 % 250 mL IVPB     500 mg 250 mL/hr over 60 Minutes Intravenous Every 24 hours 07/30/18 0432 08/06/18 0359   07/31/18 0200  cefTRIAXone (ROCEPHIN) 1 g in sodium chloride 0.9 % 100 mL IVPB     1 g 200 mL/hr over 30 Minutes Intravenous Every 24 hours 07/30/18 0432 08/06/18 0159   07/30/18 0230  cefTRIAXone (ROCEPHIN) 1 g in sodium chloride 0.9 % 100 mL IVPB     1 g 200 mL/hr over 30 Minutes Intravenous  Once 07/30/18 0229 07/30/18 0323   07/30/18 0230  azithromycin (ZITHROMAX) 500 mg in sodium chloride 0.9 % 250 mL IVPB     500 mg 250 mL/hr over  60 Minutes Intravenous  Once 07/30/18 0229 07/30/18 0433          Objective:   Vitals:   07/30/18 2352 07/31/18 0426 07/31/18 0804 07/31/18 1137  BP: 109/69 116/68 111/76 111/72  Pulse: (!) 111 (!) 102 85 87  Resp:  18 18 18   Temp: 98.5 F (36.9 C) 98.1 F (36.7 C) 98.6 F (37 C) 98.5 F (36.9 C)  TempSrc: Oral Oral Oral Oral  SpO2: 97% 94% 96% 94%  Weight:  93 kg    Height:        Wt Readings from Last 3 Encounters:  07/31/18 93 kg     Intake/Output Summary (Last 24 hours) at 07/31/2018 1253 Last data filed at 07/31/2018 1000 Gross per 24 hour  Intake 710 ml  Output -  Net 710 ml     Physical Exam  Awake Alert, Oriented X 3, No new F.N deficits, Normal affect [place].AT,PERRAL Supple Neck,No JVD, No cervical lymphadenopathy appriciated.  Symmetrical Chest wall movement, Good air movement bilaterally, still has bilateral wheezing but now has Rales as well RRR,No Gallops, Rubs or new Murmurs, No Parasternal Heave +ve B.Sounds, Abd Soft, No tenderness, No organomegaly appriciated, No rebound - guarding or rigidity. No Cyanosis, Clubbing or edema, No new Rash or bruise     Data Review:    CBC Recent Labs  Lab 07/29/18 1936 07/30/18 0640  WBC 10.7* 9.5  HGB 14.2 12.5  HCT 43.4 37.7  PLT 266 218  MCV 93.7 93.1  MCH 30.7 30.9  MCHC 32.7 33.2  RDW 12.3 12.3  LYMPHSABS 0.7 0.7  MONOABS 0.9 0.2  EOSABS 0.3 0.0  BASOSABS 0.0 0.0    Chemistries  Recent Labs  Lab 07/29/18 1936 07/30/18 0500  NA 138 137  K 3.6 3.8  CL 107 112*  CO2  18* 18*  GLUCOSE 119* 163*  BUN 10 7  CREATININE 0.98 0.86  CALCIUM 9.1 8.5*  AST 23  --   ALT 14  --   ALKPHOS 54  --   BILITOT 0.7  --    ------------------------------------------------------------------------------------------------------------------ Lactic Acid, Venous    Component Value Date/Time   LATICACIDVEN 4.98 (HH) 07/29/2018 2219     ------------------------------------------------------------------------------------------------------------------ No results for input(s): TSH, T4TOTAL, T3FREE, THYROIDAB in the last 72 hours.  Invalid input(s): FREET3 ------------------------------------------------------------------------------------------------------------------ No results for input(s): VITAMINB12, FOLATE, FERRITIN, TIBC, IRON, RETICCTPCT in the last 72 hours.  Coagulation profile No results for input(s): INR, PROTIME in the last 168 hours.  No results for input(s): DDIMER in the last 72 hours.  Cardiac Enzymes No results for input(s): CKMB, TROPONINI, MYOGLOBIN in the last 168 hours.  Invalid input(s): CK ------------------------------------------------------------------------------------------------------------------ No results found for: BNP  Micro Results Recent Results (from the past 240 hour(s))  Blood culture (routine x 2)     Status: None (Preliminary result)   Collection Time: 07/29/18 10:50 PM  Result Value Ref Range Status   Specimen Description BLOOD RIGHT ARM  Final   Special Requests   Final    BOTTLES DRAWN AEROBIC AND ANAEROBIC Blood Culture results may not be optimal due to an excessive volume of blood received in culture bottles   Culture   Final    NO GROWTH 2 DAYS Performed at [facility], [address] [place], [place] [address]    Report Status PENDING  Incomplete  Blood culture (routine x 2)     Status: None (Preliminary result)   Collection Time: 07/29/18 11:52 PM  Result Value Ref Range Status   Specimen Description BLOOD LEFT ANTECUBITAL  Final   Special Requests   Final    BOTTLES DRAWN AEROBIC AND ANAEROBIC Blood Culture results may not be optimal due to an inadequate volume of blood received in culture bottles   Culture   Final    NO GROWTH 1 DAY Performed at [facility], [address] [place], [place] [address]    Report Status PENDING  Incomplete     Radiology Reports Dg Chest 2 View  Result Date: 07/29/2018 CLINICAL DATA:  Shortness of breath and fever.  Asthma. EXAM: CHEST - 2 VIEW COMPARISON:  None. FINDINGS: The heart size and mediastinal contours are within normal limits. No evidence of pulmonary infiltrate or pleural effusion. Mild central peribronchial thickening noted, however no evidence of pulmonary hyperinflation. The visualized skeletal structures are unremarkable. IMPRESSION: No active cardiopulmonary disease. Electronically Signed   By: Myles RosenthalJohn  Stahl M.D.   On: 07/29/2018 20:06   Ct Angio Chest Pe W And/or Wo Contrast  Result Date: 07/30/2018 CLINICAL DATA:  Shortness of breath and tachycardia. Fever and chills. EXAM: CT ANGIOGRAPHY CHEST WITH CONTRAST TECHNIQUE: Multidetector CT imaging of the chest was performed using the standard protocol during bolus administration of intravenous contrast. Multiplanar CT image reconstructions and MIPs were obtained to evaluate the vascular anatomy. CONTRAST:  54mL ISOVUE-370 IOPAMIDOL (ISOVUE-370) INJECTION 76% COMPARISON:  Radiographs yesterday. FINDINGS: Cardiovascular: There are no filling defects within the pulmonary arteries to suggest pulmonary embolus.Thoracic aorta is normal in caliber without dissection. Common origin of the brachiocephalic and left common carotid artery, normal variant. The heart is normal in size. No pericardial effusion. Mediastinum/Nodes: Prominent bilateral hilar nodes, right greater than left measuring up to 11 mm. Small prevascular nodes. Triangular soft tissue density in the anterior mediastinum consistent with residual or recurrent  thymus. The esophagus is decompressed. Visualized thyroid gland is unremarkable. Lungs/Pleura: Patchy consolidation in the anterior right middle lobe, and to a lesser extent paramediastinal and apical right upper lobe. Mild central bronchial thickening. No pleural fluid or evidence pulmonary edema. Trachea and mainstem bronchi are patent.  Upper Abdomen: No acute abnormality. Musculoskeletal: There are no acute or suspicious osseous abnormalities. Review of the MIP images confirms the above findings. IMPRESSION: 1. No pulmonary embolus. 2. Patchy consolidation in the right middle and to a lesser extent right upper lobes consistent with pneumonia. Prominent bilateral hilar nodes are likely reactive. 3. Mild central bronchial thickening may be bronchitis or asthma. Electronically Signed   By: Narda Rutherford M.D.   On: 07/30/2018 02:19    Time Spent in minutes  30   Susa Raring M.D on 07/31/2018 at 12:53 PM  To page go to [email or web address] - password [facility]

## 2018-08-01 DIAGNOSIS — J189 Pneumonia, unspecified organism: Secondary | ICD-10-CM

## 2018-08-01 DIAGNOSIS — E8770 Fluid overload, unspecified: Secondary | ICD-10-CM

## 2018-08-01 DIAGNOSIS — J45901 Unspecified asthma with (acute) exacerbation: Secondary | ICD-10-CM

## 2018-08-01 DIAGNOSIS — D72829 Elevated white blood cell count, unspecified: Secondary | ICD-10-CM

## 2018-08-01 DIAGNOSIS — A419 Sepsis, unspecified organism: Principal | ICD-10-CM

## 2018-08-01 LAB — BASIC METABOLIC PANEL
Anion gap: 10 (ref 5–15)
BUN: 14 mg/dL (ref 6–20)
CALCIUM: 9.1 mg/dL (ref 8.9–10.3)
CO2: 21 mmol/L — ABNORMAL LOW (ref 22–32)
CREATININE: 0.87 mg/dL (ref 0.44–1.00)
Chloride: 106 mmol/L (ref 98–111)
GFR calc non Af Amer: >60 mL/min (ref >60)
Glucose, Bld: 128 mg/dL — ABNORMAL HIGH (ref 70–99)
Potassium: 4 mmol/L (ref 3.5–5.1)
SODIUM: 137 mmol/L (ref 135–145)

## 2018-08-01 LAB — CBC
HCT: 39.5 % (ref 36.0–46.0)
Hemoglobin: 12.9 g/dL (ref 12.0–15.0)
MCH: 30.3 pg (ref 26.0–34.0)
MCHC: 32.7 g/dL (ref 30.0–36.0)
MCV: 92.7 fL (ref 78.0–100.0)
Platelets: 295 10*3/uL (ref 150–400)
RBC: 4.26 MIL/uL (ref 3.87–5.11)
RDW: 12.6 % (ref 11.5–15.5)
WBC: 13.3 10*3/uL — ABNORMAL HIGH (ref 4.0–10.5)

## 2018-08-01 LAB — LACTIC ACID, PLASMA: LACTIC ACID, VENOUS: 1.4 mmol/L (ref 0.5–1.9)

## 2018-08-01 LAB — GLUCOSE, CAPILLARY: Glucose-Capillary: 147 mg/dL — ABNORMAL HIGH (ref 70–99)

## 2018-08-01 LAB — MAGNESIUM: MAGNESIUM: 1.8 mg/dL (ref 1.7–2.4)

## 2018-08-01 MED ORDER — AZITHROMYCIN 250 MG PO TABS
500.0000 mg | ORAL_TABLET | Freq: Every day | ORAL | Status: DC
  Administered 2018-08-02: 500 mg via ORAL
  Filled 2018-08-01: qty 2

## 2018-08-01 MED ORDER — IPRATROPIUM-ALBUTEROL 0.5-2.5 (3) MG/3ML IN SOLN
3.0000 mL | RESPIRATORY_TRACT | Status: DC | PRN
  Administered 2018-08-02: 3 mL via RESPIRATORY_TRACT

## 2018-08-01 MED ORDER — METHYLPREDNISOLONE SODIUM SUCC 125 MG IJ SOLR
60.0000 mg | Freq: Every day | INTRAMUSCULAR | Status: DC
  Administered 2018-08-02: 60 mg via INTRAVENOUS
  Filled 2018-08-01: qty 2

## 2018-08-01 MED ORDER — SODIUM CHLORIDE 0.9 % IV SOLN
1.0000 g | INTRAVENOUS | Status: DC
  Administered 2018-08-01: 1 g via INTRAVENOUS
  Filled 2018-08-01 (×2): qty 10

## 2018-08-01 MED ORDER — IPRATROPIUM-ALBUTEROL 0.5-2.5 (3) MG/3ML IN SOLN
3.0000 mL | Freq: Four times a day (QID) | RESPIRATORY_TRACT | Status: DC
  Administered 2018-08-01 – 2018-08-02 (×4): 3 mL via RESPIRATORY_TRACT
  Filled 2018-08-01 (×5): qty 3

## 2018-08-01 NOTE — Progress Notes (Signed)
PROGRESS NOTE    Joanna Swanson  ZOX:[id number] DOB: 28-Jun-1991 DOA: 07/29/2018 PCP: Patient, No Pcp Per    Brief Narrative:  27 year old female who presented with fevers, chills, wheezing and productive cough.  She does have significant past medical history of asthma and eczema.  Reported symptoms for the last 2 days prior to hospitalization, her dyspnea was refractive to bronchodilators at home.  On her initial physical examination her temperature was 37.9, heart rate 120, blood pressure 90/56, oxygen saturation in the low 90s.  Respiratory rate 22, moist mucous membranes, lungs with rhonchi and wheezing bilaterally, heart S1-S2 present tachycardic, the abdomen was soft nontender, no lower extremity edema.  Sodium 138, potassium 3.6, chloride 107, bicarb 18, glucose 119, BUN 10, creatinine 0.98, venous lactic acid 2.7-4.9, white count 10.7, hemoglobin 14.2, hematocrit 43.4, platelets 266.  Urine analysis was negative for infection.  Chest radiograph with hyperinflation, increased interstitial markings bilaterally predominantly at bases. Chest CT was negative for pulmonary embolism, positive for right middle lobe infiltrate. EKG had sinus tachycardia, normal axis and normal intervals.  Patient was admitted to the hospital with a working diagnosis of sepsis due to community-acquired right middle lobe pneumonia, complicated by asthma exacerbation.    Assessment & Plan:   Principal Problem:   Sepsis due to pneumonia [facility] Active Problems:   Asthma exacerbation   Acute hypoxemic respiratory failure (HCC)   1. Sepsis due to right middle lobe community acquired pneumonia. Patient now off IV fluids, will continue antibiotic therapy with IV ceftriaxone and po Azithromycin. Patient has been afebrile, wbc at 13,3, cultures have been no growth, legionella and streptococcus pna urine antigens negative. Will allow patient to ambulate, out of bed tid with meals, discontinue telemetry.   2. Asthma  exacerbation complicated with acute hypoxic respiratory failure. Patient not yet back to baseline, will continue aggressive bronchodilator therapy with duoneb and systemic steroids. Continue use of flutter valve. 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  3. Volume overload. Clinically has improved after diuresis with furosemide.   4. Steroid induced leukocytosis. Will reduce dose of methylprednisolone and will follow on cell count in am.    DVT prophylaxis: enoxaparin   Code Status:  full Family Communication: no family at the bedside  Disposition Plan/ discharge barriers: possible dc in am, pending clinical improvement    Consultants:     Procedures:     Antimicrobials:   Ceftriaxone  Azithromycin    Subjective: Patient reported improvement but still not back to baseline, continue top have dyspnea and cough. No nausea or vomiting. At baseline only using albuterol pre-exercise.   Objective: Vitals:   07/31/18 0804 07/31/18 1137 07/31/18 2013 08/01/18 0616  BP: 111/76 111/72 108/74 119/80  Pulse: 85 87 96 88  Resp: 18 18 18 20   Temp: 98.6 F (37 C) 98.5 F (36.9 C) 98.2 F (36.8 C) 98.5 F (36.9 C)  TempSrc: Oral Oral Oral Oral  SpO2: 96% 94% 96% 94%  Weight:    90.2 kg  Height:        Intake/Output Summary (Last 24 hours) at 08/01/2018 0846 Last data filed at 07/31/2018 1930 Gross per 24 hour  Intake 600 ml  Output 2250 ml  Net -1650 ml   Filed Weights   07/29/18 1925 07/31/18 0426 08/01/18 0616  Weight: 83.9 kg 93 kg 90.2 kg    Examination:   General: deconditioned  Neurology: Awake and alert, non focal  E ENT: mild pallor, no icterus, oral mucosa moist Cardiovascular:  No JVD. S1-S2 present, rhythmic, no gallops, rubs, or murmurs. No lower extremity edema. Pulmonary: positive breath sounds bilaterally, positive expiratory wheezing , rhonchi and rales, predominantly at the right base.  Gastrointestinal. Abdomen with no organomegaly, non tender,  no rebound or guarding Skin. No rashes Musculoskeletal: no joint deformities     Data Reviewed: I have personally reviewed following labs and imaging studies  CBC: Recent Labs  Lab 07/29/18 1936 07/30/18 0640 08/01/18 0432  WBC 10.7* 9.5 13.3*  NEUTROABS 8.7* 8.6*  --   HGB 14.2 12.5 12.9  HCT 43.4 37.7 39.5  MCV 93.7 93.1 92.7  PLT 266 218 295   Basic Metabolic Panel: Recent Labs  Lab 07/29/18 1936 07/30/18 0500 08/01/18 0432  NA 138 137 137  K 3.6 3.8 4.0  CL 107 112* 106  CO2 18* 18* 21*  GLUCOSE 119* 163* 128*  BUN 10 7 14   CREATININE 0.98 0.86 0.87  CALCIUM 9.1 8.5* 9.1  MG  --   --  1.8   GFR: Estimated Creatinine Clearance: 120.5 mL/min (by C-G formula based on SCr of 0.87 mg/dL). Liver Function Tests: Recent Labs  Lab 07/29/18 1936  AST 23  ALT 14  ALKPHOS 54  BILITOT 0.7  PROT 7.5  ALBUMIN 3.9   No results for input(s): LIPASE, AMYLASE in the last 168 hours. No results for input(s): AMMONIA in the last 168 hours. Coagulation Profile: No results for input(s): INR, PROTIME in the last 168 hours. Cardiac Enzymes: No results for input(s): CKTOTAL, CKMB, CKMBINDEX, TROPONINI in the last 168 hours. BNP (last 3 results) No results for input(s): PROBNP in the last 8760 hours. HbA1C: No results for input(s): HGBA1C in the last 72 hours. CBG: Recent Labs  Lab 07/30/18 0747 08/01/18 0737  GLUCAP 148* 147*   Lipid Profile: No results for input(s): CHOL, HDL, LDLCALC, TRIG, CHOLHDL, LDLDIRECT in the last 72 hours. Thyroid Function Tests: No results for input(s): TSH, T4TOTAL, FREET4, T3FREE, THYROIDAB in the last 72 hours. Anemia Panel: No results for input(s): VITAMINB12, FOLATE, FERRITIN, TIBC, IRON, RETICCTPCT in the last 72 hours.    Radiology Studies: I have reviewed all of the imaging during this hospital visit personally     Scheduled Meds: . enoxaparin (LOVENOX) injection  40 mg Subcutaneous Q24H  . methylPREDNISolone  (SOLU-MEDROL) injection  60 mg Intravenous Q8H  . sodium chloride flush  3 mL Intravenous Q12H   Continuous Infusions: . azithromycin 500 mg (07/31/18 0400)  . cefTRIAXone (ROCEPHIN)  IV 1 g (07/31/18 0200)     LOS: 1 day        Mauricio Annett Gulaaniel Arrien, MD [facility] Pager [phone]

## 2018-08-02 DIAGNOSIS — N179 Acute kidney failure, unspecified: Secondary | ICD-10-CM

## 2018-08-02 DIAGNOSIS — J9601 Acute respiratory failure with hypoxia: Secondary | ICD-10-CM

## 2018-08-02 LAB — CBC WITH DIFFERENTIAL/PLATELET
Abs Immature Granulocytes: 0.1 10*3/uL (ref 0.0–0.1)
BASOS ABS: 0 10*3/uL (ref 0.0–0.1)
Basophils Relative: 0 %
EOS ABS: 0 10*3/uL (ref 0.0–0.7)
EOS PCT: 0 %
HCT: 38.5 % (ref 36.0–46.0)
Hemoglobin: 12.6 g/dL (ref 12.0–15.0)
Immature Granulocytes: 1 %
Lymphocytes Relative: 31 %
Lymphs Abs: 3.8 10*3/uL (ref 0.7–4.0)
MCH: 30.4 pg (ref 26.0–34.0)
MCHC: 32.7 g/dL (ref 30.0–36.0)
MCV: 93 fL (ref 78.0–100.0)
MONO ABS: 1.3 10*3/uL — AB (ref 0.1–1.0)
Monocytes Relative: 10 %
Neutro Abs: 7.1 10*3/uL (ref 1.7–7.7)
Neutrophils Relative %: 58 %
Platelets: 302 10*3/uL (ref 150–400)
RBC: 4.14 MIL/uL (ref 3.87–5.11)
RDW: 12.4 % (ref 11.5–15.5)
WBC: 12.3 10*3/uL — ABNORMAL HIGH (ref 4.0–10.5)

## 2018-08-02 LAB — BASIC METABOLIC PANEL
ANION GAP: 11 (ref 5–15)
Anion gap: 11 (ref 5–15)
BUN: 18 mg/dL (ref 6–20)
BUN: 15 mg/dL (ref 6–20)
CHLORIDE: 107 mmol/L (ref 98–111)
CHLORIDE: 104 mmol/L (ref 98–111)
CO2: 21 mmol/L — AB (ref 22–32)
CO2: 20 mmol/L — ABNORMAL LOW (ref 22–32)
Calcium: 8.7 mg/dL — ABNORMAL LOW (ref 8.9–10.3)
Calcium: 8.8 mg/dL — ABNORMAL LOW (ref 8.9–10.3)
Creatinine, Ser: 1.17 mg/dL — ABNORMAL HIGH (ref 0.44–1.00)
Creatinine, Ser: 0.97 mg/dL (ref 0.44–1.00)
GFR calc non Af Amer: >60 mL/min (ref >60)
Glucose, Bld: 86 mg/dL (ref 70–99)
Glucose, Bld: 106 mg/dL — ABNORMAL HIGH (ref 70–99)
POTASSIUM: 3.4 mmol/L — AB (ref 3.5–5.1)
POTASSIUM: 3.6 mmol/L (ref 3.5–5.1)
SODIUM: 139 mmol/L (ref 135–145)
Sodium: 135 mmol/L (ref 135–145)

## 2018-08-02 LAB — GLUCOSE, CAPILLARY: Glucose-Capillary: 94 mg/dL (ref 70–99)

## 2018-08-02 MED ORDER — PNEUMOCOCCAL VAC POLYVALENT 25 MCG/0.5ML IJ INJ
0.5000 mL | INJECTION | INTRAMUSCULAR | Status: AC
  Administered 2018-08-02: 0.5 mL via INTRAMUSCULAR

## 2018-08-02 MED ORDER — IPRATROPIUM-ALBUTEROL 0.5-2.5 (3) MG/3ML IN SOLN: 3.0000 mL | Freq: Four times a day (QID) | RESPIRATORY_TRACT | 0 refills | Status: AC | PRN

## 2018-08-02 MED ORDER — AMOXICILLIN-POT CLAVULANATE 875-125 MG PO TABS: 1.0000 | ORAL_TABLET | Freq: Two times a day (BID) | ORAL | 0 refills | Status: AC

## 2018-08-02 MED ORDER — INFLUENZA VAC SPLIT QUAD 0.5 ML IM SUSY
0.5000 mL | PREFILLED_SYRINGE | INTRAMUSCULAR | Status: AC
  Administered 2018-08-02: 0.5 mL via INTRAMUSCULAR

## 2018-08-02 MED ORDER — AMOXICILLIN-POT CLAVULANATE 875-125 MG PO TABS
1.0000 | ORAL_TABLET | Freq: Two times a day (BID) | ORAL | Status: DC
  Administered 2018-08-02: 1 via ORAL
  Filled 2018-08-02: qty 1

## 2018-08-02 MED ORDER — SODIUM CHLORIDE 0.9 % IV BOLUS
500.0000 mL | Freq: Once | INTRAVENOUS | Status: AC
  Administered 2018-08-02: 500 mL via INTRAVENOUS

## 2018-08-02 NOTE — Discharge Instructions (Signed)
Asthma, Adult Asthma is a condition of the lungs in which the airways tighten and narrow. Asthma can make it hard to breathe. Asthma cannot be cured, but medicine and lifestyle changes can help control it. Asthma may be started (triggered) by:  Animal skin flakes (dander).  Dust.  Cockroaches.  Pollen.  Mold.  Smoke.  Cleaning products.  Hair sprays or aerosol sprays.  Paint fumes or strong smells.  Cold air, weather changes, and winds.  Crying or laughing hard.  Stress.  Certain medicines or drugs.  Foods, such as dried fruit, potato chips, and sparkling grape juice.  Infections or conditions (colds, flu).  Exercise.  Certain medical conditions or diseases.  Exercise or tiring activities.  Follow these instructions at home:  Take medicine as told by your doctor.  Use a peak flow meter as told by your doctor. A peak flow meter is a tool that measures how well the lungs are working.  Record and keep track of the peak flow meter's readings.  Understand and use the asthma action plan. An asthma action plan is a written plan for taking care of your asthma and treating your attacks.  To help prevent asthma attacks: ? Do not smoke. Stay away from secondhand smoke. ? Change your heating and air conditioning filter often. ? Limit your use of fireplaces and wood stoves. ? Get rid of pests (such as roaches and mice) and their droppings. ? Throw away plants if you see mold on them. ? Clean your floors. Dust regularly. Use cleaning products that do not smell. ? Have someone vacuum when you are not home. Use a vacuum cleaner with a HEPA filter if possible. ? Replace carpet with wood, tile, or vinyl flooring. Carpet can trap animal skin flakes and dust. ? Use allergy-proof pillows, mattress covers, and box spring covers. ? Wash bed sheets and blankets every week in hot water and dry them in a dryer. ? Use blankets that are made of polyester or cotton. ? Clean bathrooms  and kitchens with bleach. If possible, have someone repaint the walls in these rooms with mold-resistant paint. Keep out of the rooms that are being cleaned and painted. ? Wash hands often. Contact a doctor if:  You have make a whistling sound when breaking (wheeze), have shortness of breath, or have a cough even if taking medicine to prevent attacks.  The colored mucus you cough up (sputum) is thicker than usual.  The colored mucus you cough up changes from clear or white to yellow, green, gray, or bloody.  You have problems from the medicine you are taking such as: ? A rash. ? Itching. ? Swelling. ? Trouble breathing.  You need reliever medicines more than 2-3 times a week.  Your peak flow measurement is still at 50-79% of your personal best after following the action plan for 1 hour.  You have a fever. Get help right away if:  You seem to be worse and are not responding to medicine during an asthma attack.  You are short of breath even at rest.  You get short of breath when doing very little activity.  You have trouble eating, drinking, or talking.  You have chest pain.  You have a fast heartbeat.  Your lips or fingernails start to turn blue.  You are light-headed, dizzy, or faint.  Your peak flow is less than 50% of your personal best. This information is not intended to replace advice given to you by your health care provider. Make   sure you discuss any questions you have with your health care provider. Document Released: 04/23/2008 Document Revised: 04/12/2016 Document Reviewed: 06/04/2013 Elsevier Interactive Patient Education  2017 [employer]  Community-Acquired Pneumonia, Adult Pneumonia is an infection of the lungs. One type of pneumonia can happen while a person is in a hospital. A different type can happen when a person is not in a hospital (community-acquired pneumonia). It is easy for this kind to spread from person to person. It can spread to you if you  breathe near an infected person who coughs or sneezes. Some symptoms include:  A dry cough.  A wet (productive) cough.  Fever.  Sweating.  Chest pain.  Follow these instructions at home:  Take over-the-counter and prescription medicines only as told by your doctor. ? Only take cough medicine if you are losing sleep. ? If you were prescribed an antibiotic medicine, take it as told by your doctor. Do not stop taking the antibiotic even if you start to feel better.  Sleep with your head and neck raised (elevated). You can do this by putting a few pillows under your head, or you can sleep in a recliner.  Do not use tobacco products. These include cigarettes, chewing tobacco, and e-cigarettes. If you need help quitting, ask your doctor.  Drink enough water to keep your pee (urine) clear or pale yellow. A shot (vaccine) can help prevent pneumonia. Shots are often suggested for:  People older than 25 years of age.  People older than 25 years of age: ? Who are having cancer treatment. ? Who have long-term (chronic) lung disease. ? Who have problems with their body's defense system (immune system).  You may also prevent pneumonia if you take these actions:  Get the flu (influenza) shot every year.  Go to the dentist as often as told.  Wash your hands often. If soap and water are not available, use hand sanitizer.  Contact a doctor if:  You have a fever.  You lose sleep because your cough medicine does not help. Get help right away if:  You are short of breath and it gets worse.  You have more chest pain.  Your sickness gets worse. This is very serious if: ? You are an older adult. ? Your body's defense system is weak.  You cough up blood. This information is not intended to replace advice given to you by your health care provider. Make sure you discuss any questions you have with your health care provider. Document Released: 04/23/2008 Document Revised: 04/12/2016  Document Reviewed: 03/02/2015 Elsevier Interactive Patient Education  2018 [employer]  

## 2018-08-02 NOTE — Discharge Summary (Addendum)
Physician Discharge Summary  Joanna Swanson BJY:[id number] DOB: [birth year] DOA: 07/29/2018  PCP: Patient, No Pcp Per  Admit date: 07/29/2018 Discharge date: 08/02/2018  Admitted From: Home  Disposition:  Home   Recommendations for Outpatient Follow-up and new medication changes:  1. Follow up with Primary Care in 7 days.  2. Continue Augmentin for 4 more days.  3. Continue bronchodilator as needed with albuterol-ipratropium.    Home Health: no   Equipment/Devices: nebulizer machine   Discharge Condition: stable  CODE STATUS: full  Diet recommendation: Regular.   Brief/Interim Summary: 27 year old female who presented with fevers, chills, wheezing and productive cough.  She does have significant past medical history of asthma and eczema.  Reported symptoms for the last 2 days prior to hospitalization, her dyspnea was refractive to bronchodilators at home.  On her initial physical examination her temperature was 37.9, heart rate 120, blood pressure 90/56, oxygen saturation in the low 90s.  Respiratory rate 22, moist mucous membranes, lungs with rhonchi and wheezing bilaterally, heart S1-S2 present tachycardic, the abdomen was soft nontender, no lower extremity edema.  Sodium 138, potassium 3.6, chloride 107, bicarb 18, glucose 119, BUN 10, creatinine 0.98, venous lactic acid 2.7-4.9, white count 10.7, hemoglobin 14.2, hematocrit 43.4, platelets 266.  Urine analysis was negative for infection.  Chest radiograph with hyperinflation, increased interstitial markings bilaterally predominantly at bases. Chest CT was negative for pulmonary embolism, positive for right middle lobe infiltrate. EKG had sinus tachycardia, normal axis and normal intervals.  Patient was admitted to the hospital with a working diagnosis of sepsis due to community-acquired right middle lobe pneumonia, complicated by asthma exacerbation.  1.  Sepsis due to right middle lobe community-acquired pneumonia.  Patient was admitted  to the stepdown unit, she received IV fluids and IV antibiotic therapy with ceftriaxone and azithromycin.  She had improvement of her symptoms, remain afebrile, her discharge white cell count is 12.3.  Her blood cultures remain negative, urine antigens for Legionella and Streptococcus pneumonia were negative.  Patient will continue antibiotic therapy with Augmentin for 4 more days.  2.  Acute asthma exacerbation.  Patient received aggressive bronchodilator therapy with albuterol/ipratropium, systemic steroids with IV methylprednisolone, oximetry monitoring and supplemental oxygen per nasal cannula.  She responded well to the medical therapy, at discharge systemic steroids will be discontinued, but patient will continue with as needed bronchodilators.  Nebulizer machine will prescribe.  Patient will follow-up as an outpatient.  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  3.  Volume overload.  Patient was found volume overloaded related to IV fluids, received furosemide with improvement of her symptoms.  4.  Prerenal acute kidney injury.  Peak creatinine 1.17, at discharge her creatinine is down to 0.97.   5.  Steroid induced leukocytosis.  Peak white cell count 13.3, the dose of the steroids was reduced with improvement of her white cell count down to 12.3 at discharge.  Discharge Diagnoses:  Principal Problem:   Sepsis due to pneumonia [facility]) Active Problems:   Asthma exacerbation   Acute hypoxemic respiratory failure (HCC)    Discharge Instructions   Allergies as of 08/02/2018   No Known Allergies     Medication List    TAKE these medications   albuterol 108 (90 Base) MCG/ACT inhaler Commonly known as:  PROVENTIL HFA;VENTOLIN HFA Inhale 2 puffs into the lungs every 4 (four) hours as needed for wheezing or shortness of breath.   amoxicillin-clavulanate 875-125 MG tablet Commonly known as:  AUGMENTIN Take 1 tablet by mouth every  12 (twelve) hours for 4 days.    ibuprofen 200 MG tablet Commonly known as:  ADVIL,MOTRIN Take 600 mg by mouth every 6 (six) hours as needed (for pain or headaches).   ipratropium-albuterol 0.5-2.5 (3) MG/3ML Soln Commonly known as:  DUONEB Take 3 mLs by nebulization every 6 (six) hours as needed (as needed for shortness of breath.).            Durable Medical Equipment  (From admission, onward)         Start     Ordered   08/02/18 0000  DME Nebulizer machine    Question:  Patient needs a nebulizer to treat with the following condition  Answer:  Asthma   08/02/18 0936          No Known Allergies  Consultations:     Procedures/Studies: Dg Chest 2 View  Result Date: 07/29/2018 CLINICAL DATA:  Shortness of breath and fever.  Asthma. EXAM: CHEST - 2 VIEW COMPARISON:  None. FINDINGS: The heart size and mediastinal contours are within normal limits. No evidence of pulmonary infiltrate or pleural effusion. Mild central peribronchial thickening noted, however no evidence of pulmonary hyperinflation. The visualized skeletal structures are unremarkable. IMPRESSION: No active cardiopulmonary disease. Electronically Signed   By: Myles RosenthalJohn  Stahl M.D.   On: 07/29/2018 20:06   Ct Angio Chest Pe W And/or Wo Contrast  Result Date: 07/30/2018 CLINICAL DATA:  Shortness of breath and tachycardia. Fever and chills. EXAM: CT ANGIOGRAPHY CHEST WITH CONTRAST TECHNIQUE: Multidetector CT imaging of the chest was performed using the standard protocol during bolus administration of intravenous contrast. Multiplanar CT image reconstructions and MIPs were obtained to evaluate the vascular anatomy. CONTRAST:  54mL ISOVUE-370 IOPAMIDOL (ISOVUE-370) INJECTION 76% COMPARISON:  Radiographs yesterday. FINDINGS: Cardiovascular: There are no filling defects within the pulmonary arteries to suggest pulmonary embolus.Thoracic aorta is normal in caliber without dissection. Common origin of the brachiocephalic and left common carotid artery, normal  variant. The heart is normal in size. No pericardial effusion. Mediastinum/Nodes: Prominent bilateral hilar nodes, right greater than left measuring up to 11 mm. Small prevascular nodes. Triangular soft tissue density in the anterior mediastinum consistent with residual or recurrent thymus. The esophagus is decompressed. Visualized thyroid gland is unremarkable. Lungs/Pleura: Patchy consolidation in the anterior right middle lobe, and to a lesser extent paramediastinal and apical right upper lobe. Mild central bronchial thickening. No pleural fluid or evidence pulmonary edema. Trachea and mainstem bronchi are patent. Upper Abdomen: No acute abnormality. Musculoskeletal: There are no acute or suspicious osseous abnormalities. Review of the MIP images confirms the above findings. IMPRESSION: 1. No pulmonary embolus. 2. Patchy consolidation in the right middle and to a lesser extent right upper lobes consistent with pneumonia. Prominent bilateral hilar nodes are likely reactive. 3. Mild central bronchial thickening may be bronchitis or asthma. Electronically Signed   By: Narda RutherfordMelanie  Sanford M.D.   On: 07/30/2018 02:19       Subjective: Patient is feeling better, dyspnea back to baseline, no nausea or vomiting, no chest pain, no edema.   Discharge Exam: Vitals:   08/02/18 0519 08/02/18 0904  BP: 131/90   Pulse: 70   Resp: 18   Temp: 98.4 F (36.9 C)   SpO2: 96% 96%   Vitals:   08/01/18 2016 08/01/18 2050 08/02/18 0519 08/02/18 0904  BP: 117/78  131/90   Pulse: 80  70   Resp: 18  18   Temp: 98.6 F (37 C)  98.4 F (36.9 C)  TempSrc: Oral  Oral   SpO2: 97% 97% 96% 96%  Weight:   91.4 kg   Height:        General: Not in pain or dyspnea Neurology: Awake and alert, non focal  E ENT: no pallor, no icterus, oral mucosa moist Cardiovascular: No JVD. S1-S2 present, rhythmic, no gallops, rubs, or murmurs. No lower extremity edema. Pulmonary: positive breath sounds bilaterally, adequate air  movement, scaterred wheezing and rhonchi, but no rales. Gastrointestinal. Abdomen with no organomegaly, non tender, no rebound or guarding Skin. No rashes Musculoskeletal: no joint deformities   The results of significant diagnostics from this hospitalization (including imaging, microbiology, ancillary and laboratory) are listed below for reference.     Microbiology: Recent Results (from the past 240 hour(s))  Blood culture (routine x 2)     Status: None (Preliminary result)   Collection Time: 07/29/18 10:50 PM  Result Value Ref Range Status   Specimen Description BLOOD RIGHT ARM  Final   Special Requests   Final    BOTTLES DRAWN AEROBIC AND ANAEROBIC Blood Culture results may not be optimal due to an excessive volume of blood received in culture bottles   Culture   Final    NO GROWTH 3 DAYS Performed at [facility], [address], [place], [place] [address]    Report Status PENDING  Incomplete  Blood culture (routine x 2)     Status: None (Preliminary result)   Collection Time: 07/29/18 11:52 PM  Result Value Ref Range Status   Specimen Description BLOOD LEFT ANTECUBITAL  Final   Special Requests   Final    BOTTLES DRAWN AEROBIC AND ANAEROBIC Blood Culture results may not be optimal due to an inadequate volume of blood received in culture bottles   Culture   Final    NO GROWTH 2 DAYS Performed at [facility], [address] [place], [place] [address]    Report Status PENDING  Incomplete     Labs: BNP (last 3 results) No results for input(s): BNP in the last 8760 hours. Basic Metabolic Panel: Recent Labs  Lab 07/29/18 1936 07/30/18 0500 08/01/18 0432 08/02/18 0443  NA 138 137 137 139  K 3.6 3.8 4.0 3.4*  CL 107 112* 106 107  CO2 18* 18* 21* 21*  GLUCOSE 119* 163* 128* 86  BUN 10 7 14 18   CREATININE 0.98 0.86 0.87 1.17*  CALCIUM 9.1 8.5* 9.1 8.7*  MG  --   --  1.8  --    Liver Function Tests: Recent Labs  Lab 07/29/18 1936  AST 23  ALT 14   ALKPHOS 54  BILITOT 0.7  PROT 7.5  ALBUMIN 3.9   No results for input(s): LIPASE, AMYLASE in the last 168 hours. No results for input(s): AMMONIA in the last 168 hours. CBC: Recent Labs  Lab 07/29/18 1936 07/30/18 0640 08/01/18 0432 08/02/18 0443  WBC 10.7* 9.5 13.3* 12.3*  NEUTROABS 8.7* 8.6*  --  7.1  HGB 14.2 12.5 12.9 12.6  HCT 43.4 37.7 39.5 38.5  MCV 93.7 93.1 92.7 93.0  PLT 266 218 295 302   Cardiac Enzymes: No results for input(s): CKTOTAL, CKMB, CKMBINDEX, TROPONINI in the last 168 hours. BNP: Invalid input(s): POCBNP CBG: Recent Labs  Lab 07/30/18 0747 08/01/18 0737 08/02/18 0620  GLUCAP 148* 147* 94   D-Dimer No results for input(s): DDIMER in the last 72 hours. Hgb A1c No results for input(s): HGBA1C in the last 72 hours. Lipid Profile No  results for input(s): CHOL, HDL, LDLCALC, TRIG, CHOLHDL, LDLDIRECT in the last 72 hours. Thyroid function studies No results for input(s): TSH, T4TOTAL, T3FREE, THYROIDAB in the last 72 hours.  Invalid input(s): FREET3 Anemia work up No results for input(s): VITAMINB12, FOLATE, FERRITIN, TIBC, IRON, RETICCTPCT in the last 72 hours. Urinalysis    Component Value Date/Time   COLORURINE YELLOW 07/29/2018 1929   APPEARANCEUR CLOUDY (A) 07/29/2018 1929   LABSPEC 1.031 (H) 07/29/2018 1929   PHURINE 5.0 07/29/2018 1929   GLUCOSEU NEGATIVE 07/29/2018 1929   HGBUR NEGATIVE 07/29/2018 1929   BILIRUBINUR NEGATIVE 07/29/2018 1929   KETONESUR NEGATIVE 07/29/2018 1929   PROTEINUR NEGATIVE 07/29/2018 1929   NITRITE NEGATIVE 07/29/2018 1929   LEUKOCYTESUR NEGATIVE 07/29/2018 1929   Sepsis Labs Invalid input(s): PROCALCITONIN,  WBC,  LACTICIDVEN Microbiology Recent Results (from the past 240 hour(s))  Blood culture (routine x 2)     Status: None (Preliminary result)   Collection Time: 07/29/18 10:50 PM  Result Value Ref Range Status   Specimen Description BLOOD RIGHT ARM  Final   Special Requests   Final    BOTTLES  DRAWN AEROBIC AND ANAEROBIC Blood Culture results may not be optimal due to an excessive volume of blood received in culture bottles   Culture   Final    NO GROWTH 3 DAYS Performed at [facility], [address] [place], [place] [address]    Report Status PENDING  Incomplete  Blood culture (routine x 2)     Status: None (Preliminary result)   Collection Time: 07/29/18 11:52 PM  Result Value Ref Range Status   Specimen Description BLOOD LEFT ANTECUBITAL  Final   Special Requests   Final    BOTTLES DRAWN AEROBIC AND ANAEROBIC Blood Culture results may not be optimal due to an inadequate volume of blood received in culture bottles   Culture   Final    NO GROWTH 2 DAYS Performed at [facility], [address], [place], [place] [address]    Report Status PENDING  Incomplete     Time coordinating discharge: 45 minutes  SIGNED:   Coralie Keens, MD  [facility] 08/02/2018, 9:22 AM Pager [phone]  If 7PM-7AM, please contact night-coverage [email or web address] Password [email or web address]

## 2018-08-02 NOTE — Care Management (Signed)
[facility] asked to deliver nebulizer maching to patient's room prior to d/c.

## 2018-08-02 NOTE — Progress Notes (Signed)
Patient wanted to be woken up at 0330 originally for treatment, but upon awakening patient no longer wanted to do the treatment and would rather sleep. No distress noted RCP will continue to follow.

## 2018-08-03 LAB — CULTURE, BLOOD (ROUTINE X 2): CULTURE: NO GROWTH

## 2018-08-03 NOTE — Care Management (Signed)
08/03/18 0830 Call received from unit that patient contacted unit this morning saying she never received nebulizer.  [facility] contacted and will deliver to pt's home today.

## 2018-08-04 LAB — CULTURE, BLOOD (ROUTINE X 2): Culture: NO GROWTH

## 2019-08-17 IMAGING — CR DG CHEST 2V
2 series · 2 of 2 positions shown · non-contrast
Comparison: None.

CLINICAL DATA: Shortness of breath and fever.  Asthma.

EXAM:
CHEST - 2 VIEW

[chest pa]
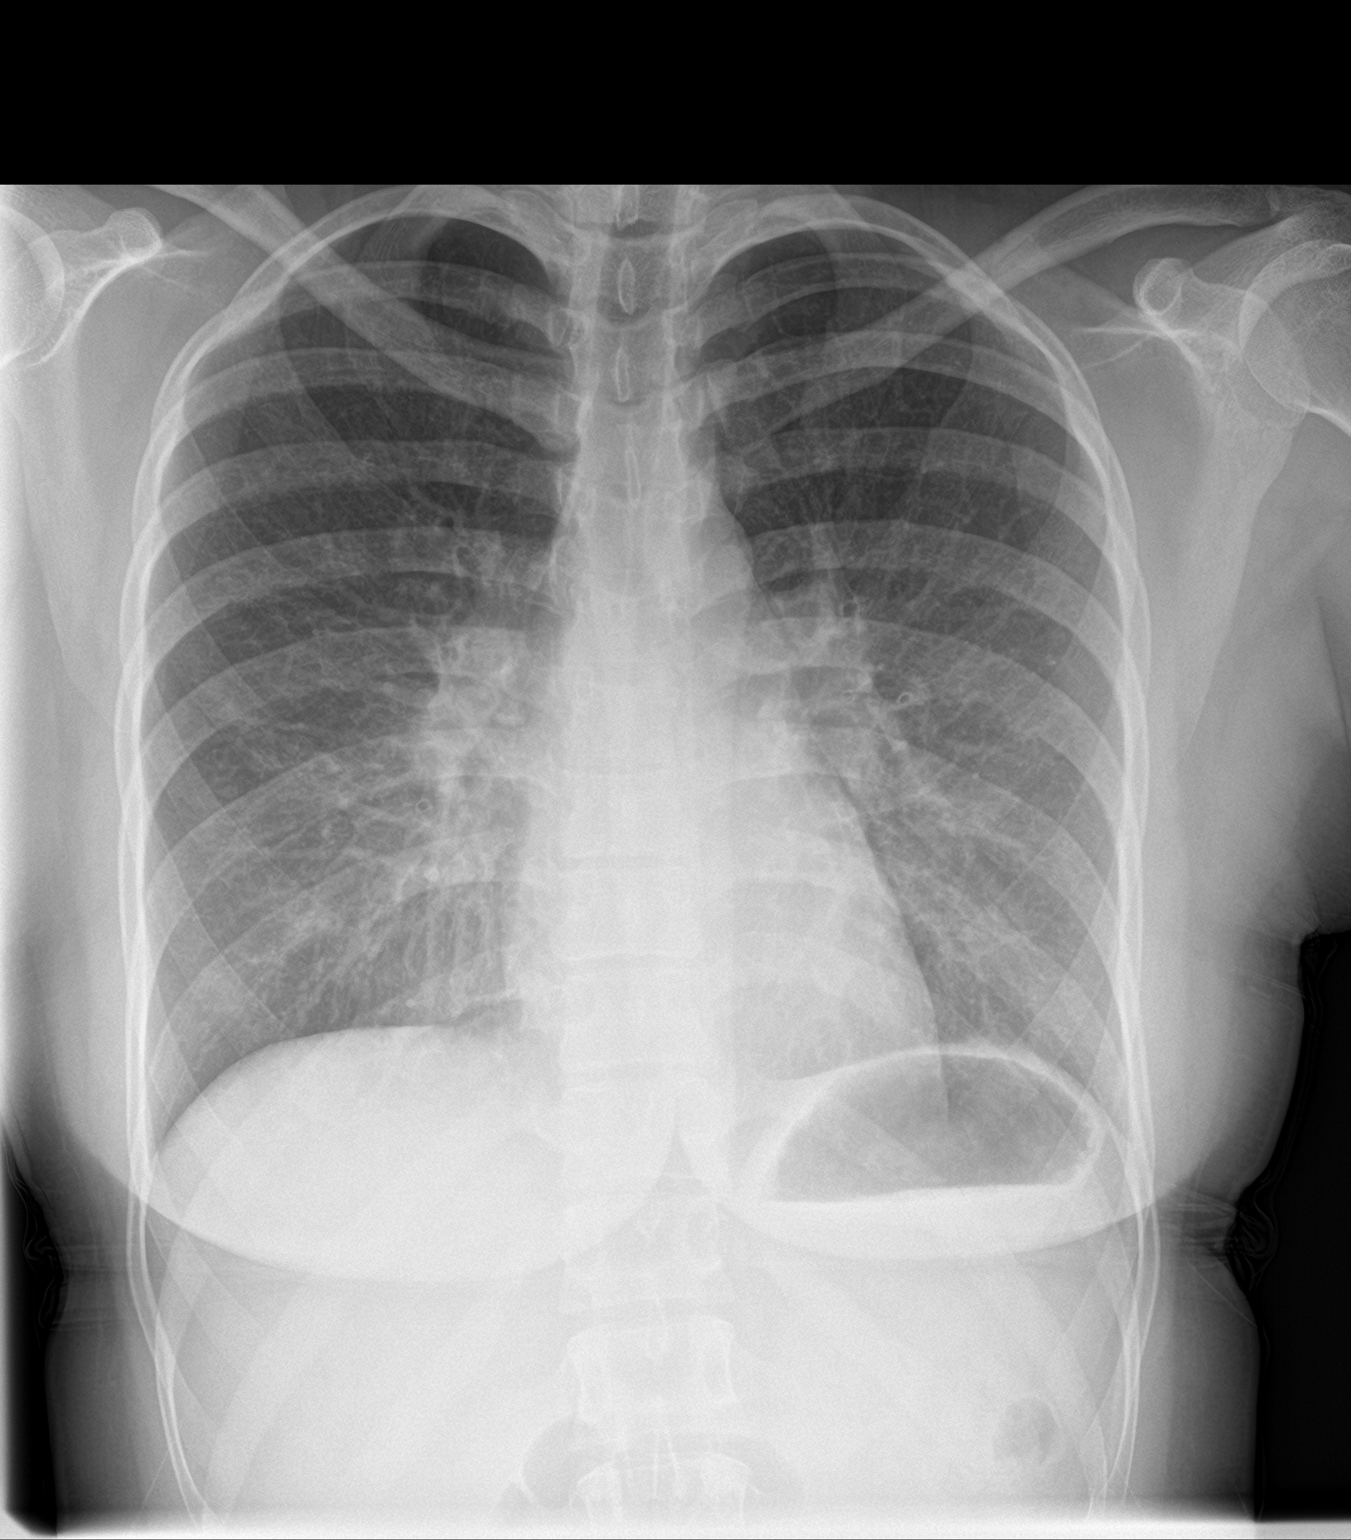

[chest lat]
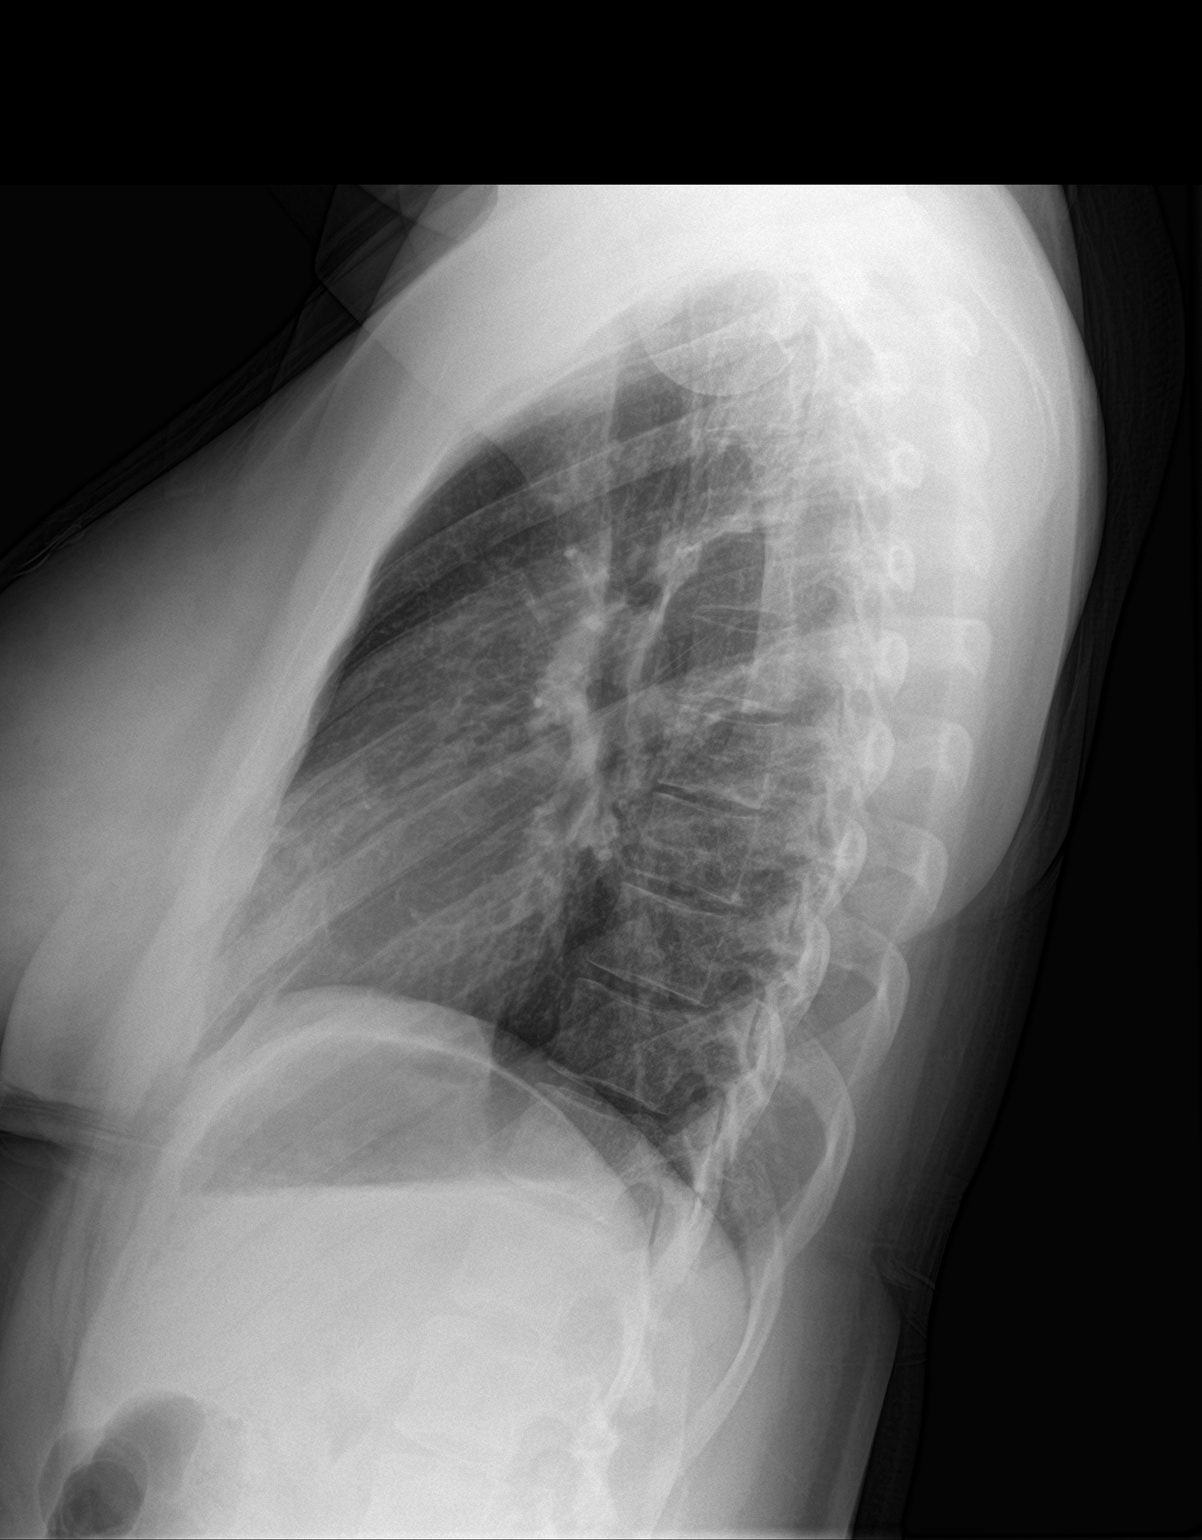

[2 of 2 positions shown; findings below may reference images not displayed]

FINDINGS: The heart size and mediastinal contours are within normal limits. No
evidence of pulmonary infiltrate or pleural effusion. Mild central
peribronchial thickening noted, however no evidence of pulmonary
hyperinflation. The visualized skeletal structures are unremarkable.
IMPRESSION: No active cardiopulmonary disease.

## 2019-08-18 IMAGING — CT CT ANGIO CHEST
2 of 7 series · 19 of 46 positions shown · IV contrast (APPLIED)
Comparison: Radiographs yesterday.

CLINICAL DATA: Shortness of breath and tachycardia. Fever and
chills.

EXAM:
CT ANGIOGRAPHY CHEST WITH CONTRAST
TECHNIQUE: Multidetector CT imaging of the chest was performed using the
standard protocol during bolus administration of intravenous
contrast. Multiplanar CT image reconstructions and MIPs were
obtained to evaluate the vascular anatomy.
CONTRAST:  54mL 3TTFTR-PQB IOPAMIDOL (3TTFTR-PQB) INJECTION 76%

[Series 8: thins · axial · 0.66mm/px · z∈[+1130,+1404]mm · 16 of 442 slices shown]
[im 25/442  lung]
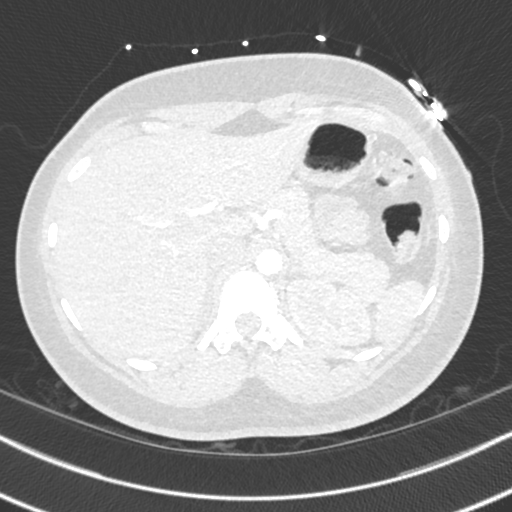
[im 50/442  soft-tissue]
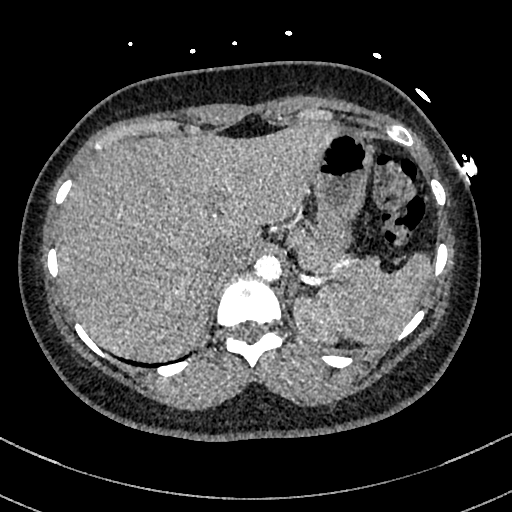
[im 74/442  lung]
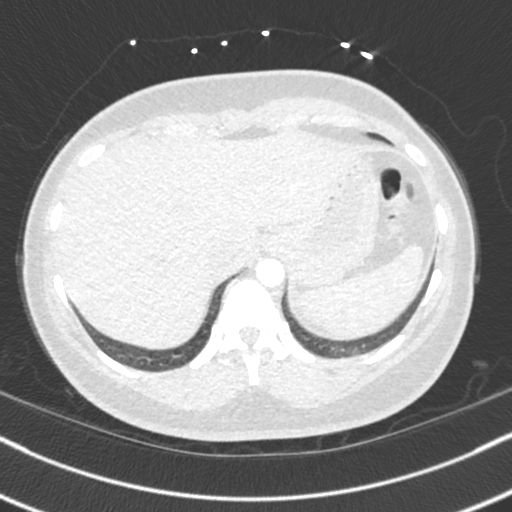
[im 99/442  soft-tissue]
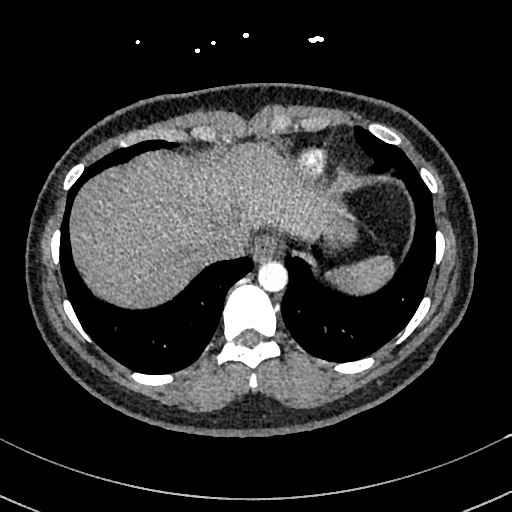
[im 123/442  lung]
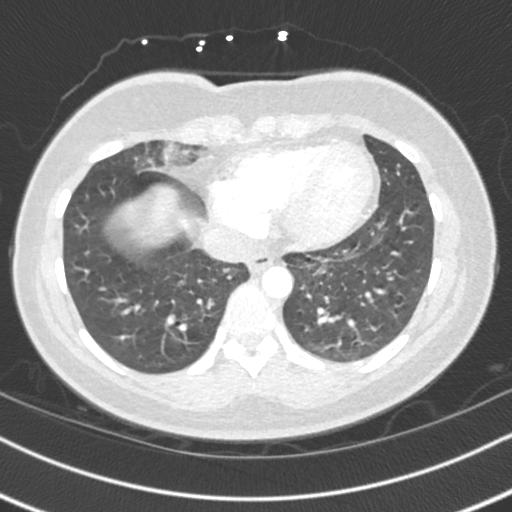
[im 148/442  soft-tissue]
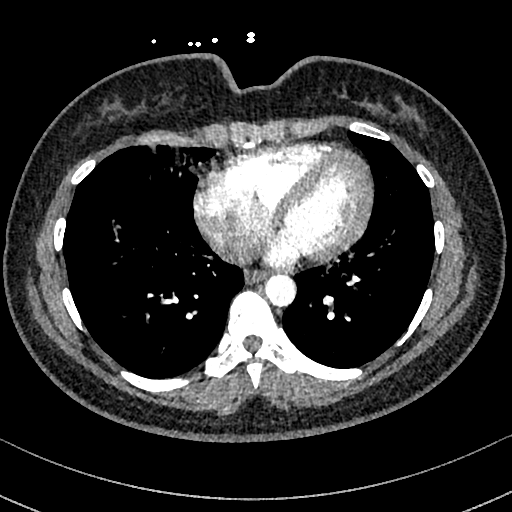
[im 172/442  lung]
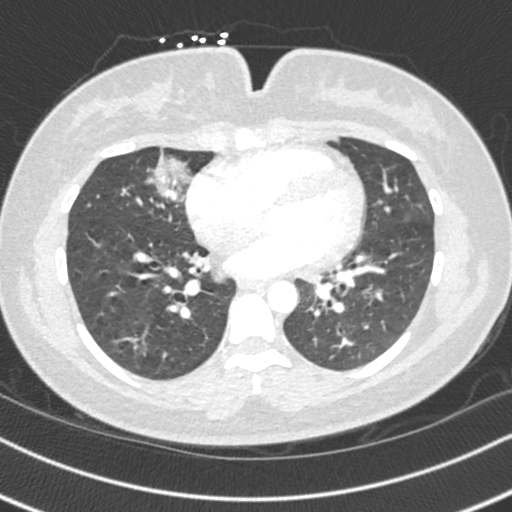
[im 197/442  soft-tissue]
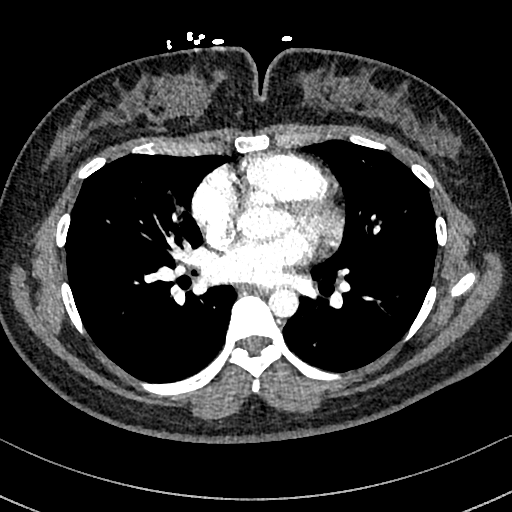
[im 246/442  lung]
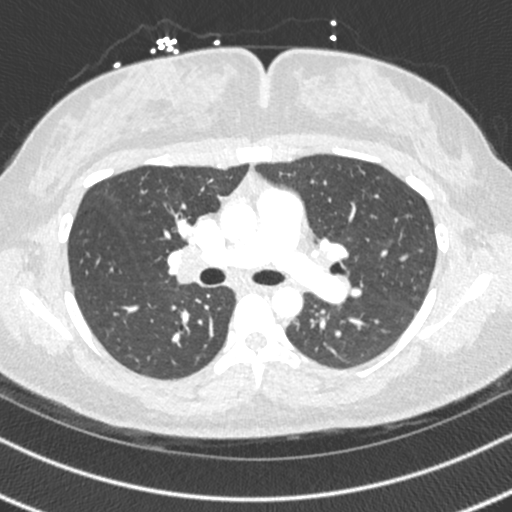
[im 270/442  soft-tissue]
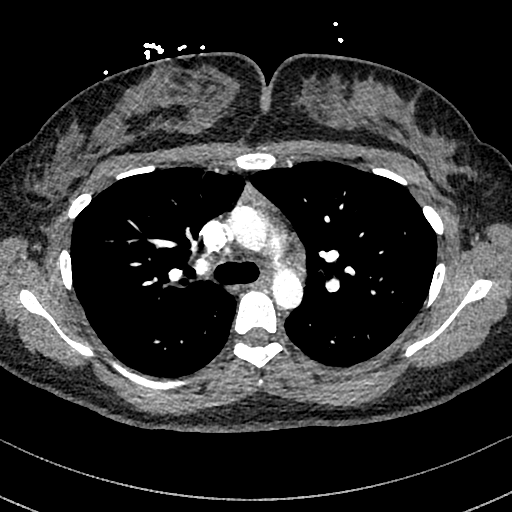
[im 295/442  lung]
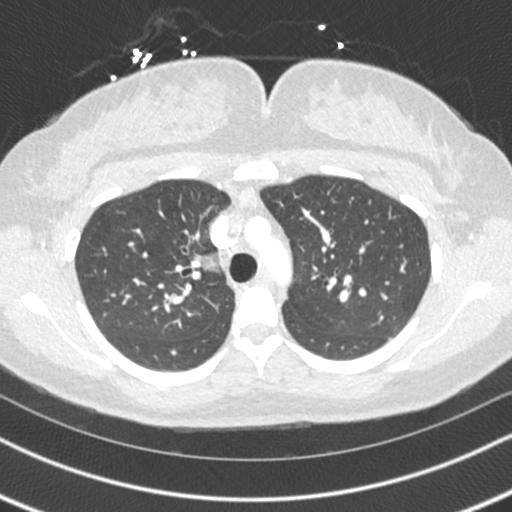
[im 319/442  soft-tissue]
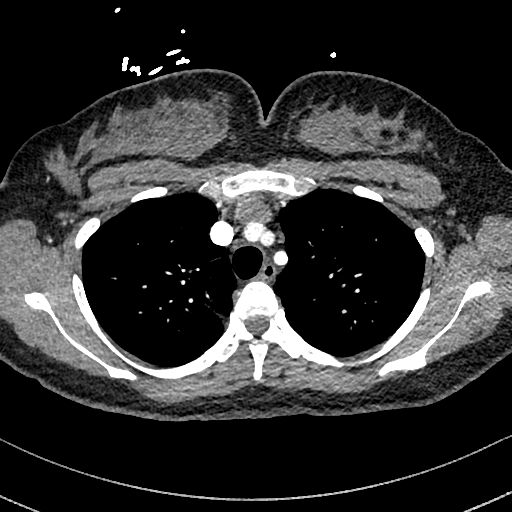
[im 344/442  lung]
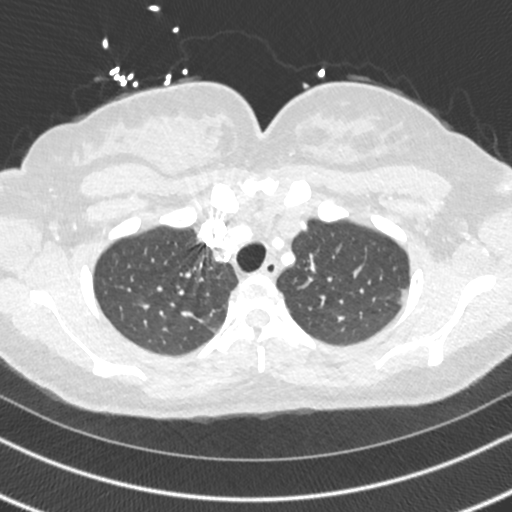
[im 368/442  soft-tissue]
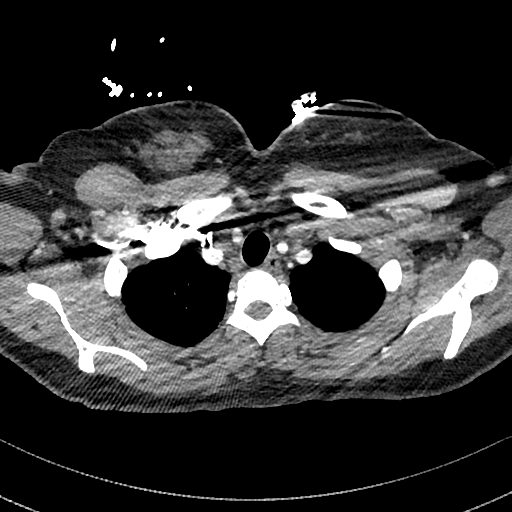
[im 393/442  lung]
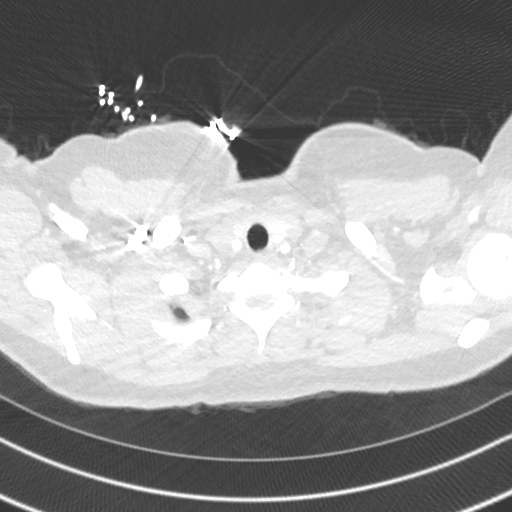
[im 417/442  soft-tissue]
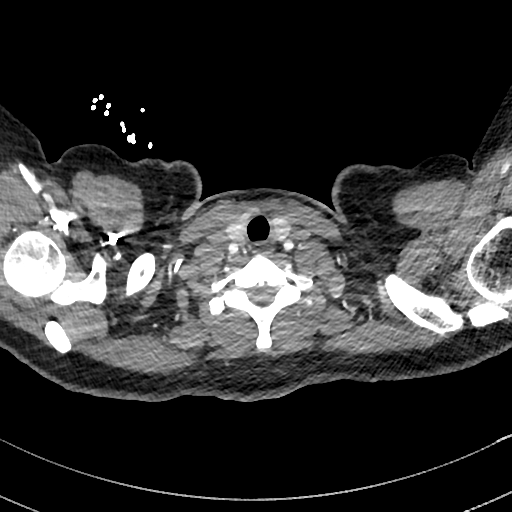

[Series 9: cor · coronal · 0.61mm/px · 3 of 111 slices shown]
[im 28/111  soft-tissue]
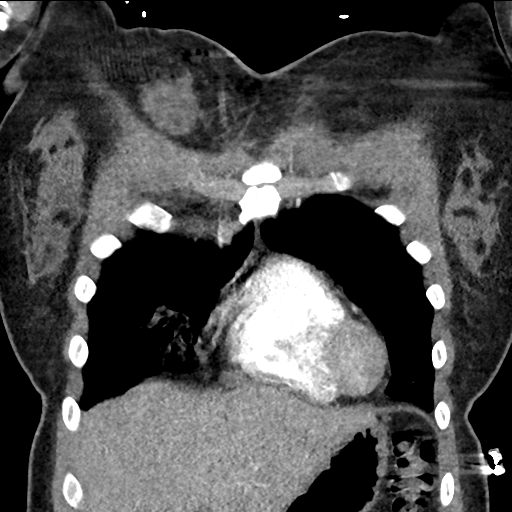
[im 56/111  soft-tissue]
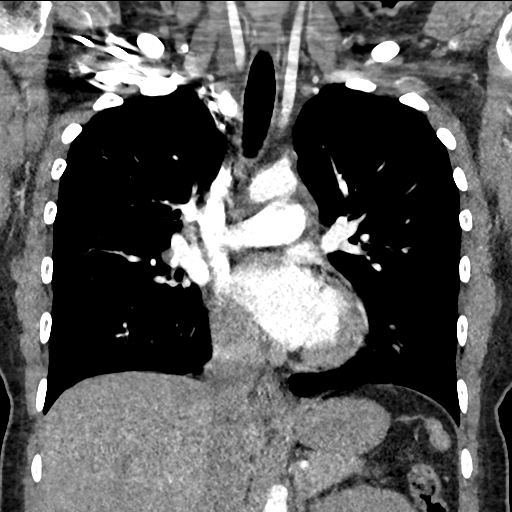
[im 83/111  soft-tissue]
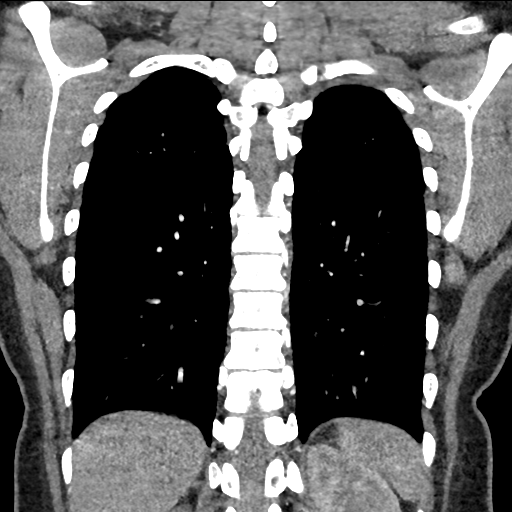

[19 of 46 positions shown; findings below may reference images not displayed]

FINDINGS: Cardiovascular: There are no filling defects within the pulmonary
arteries to suggest pulmonary embolus.Thoracic aorta is normal in
caliber without dissection. Common origin of the brachiocephalic and
left common carotid artery, normal variant. The heart is normal in
size. No pericardial effusion.

Mediastinum/Nodes: Prominent bilateral hilar nodes, right greater
than left measuring up to 11 mm. Small prevascular nodes. Triangular
soft tissue density in the anterior mediastinum consistent with
residual or recurrent thymus. The esophagus is decompressed.
Visualized thyroid gland is unremarkable.

Lungs/Pleura: Patchy consolidation in the anterior right middle
lobe, and to a lesser extent paramediastinal and apical right upper
lobe. Mild central bronchial thickening. No pleural fluid or
evidence pulmonary edema. Trachea and mainstem bronchi are patent.

Upper Abdomen: No acute abnormality.

Musculoskeletal: There are no acute or suspicious osseous
abnormalities.

Review of the MIP images confirms the above findings.
IMPRESSION: 1. No pulmonary embolus.
2. Patchy consolidation in the right middle and to a lesser extent
right upper lobes consistent with pneumonia. Prominent bilateral
hilar nodes are likely reactive.
3. Mild central bronchial thickening may be bronchitis or asthma.
